# Patient Record
Sex: Female | Born: 1985 | Race: Black or African American | Hispanic: No | Marital: Married | Smoking: Current every day smoker
Health system: Southern US, Community
[De-identification: ages and names within clinical notes are randomized; demographics above are authoritative.]

## PROBLEM LIST (undated history)

## (undated) DIAGNOSIS — M549 Dorsalgia, unspecified: Secondary | ICD-10-CM

## (undated) DIAGNOSIS — J45909 Unspecified asthma, uncomplicated: Secondary | ICD-10-CM

## (undated) DIAGNOSIS — D649 Anemia, unspecified: Secondary | ICD-10-CM

## (undated) DIAGNOSIS — E119 Type 2 diabetes mellitus without complications: Secondary | ICD-10-CM

## (undated) DIAGNOSIS — E162 Hypoglycemia, unspecified: Secondary | ICD-10-CM

## (undated) DIAGNOSIS — G43909 Migraine, unspecified, not intractable, without status migrainosus: Secondary | ICD-10-CM

## (undated) HISTORY — PX: WISDOM TOOTH EXTRACTION: SHX21

## (undated) HISTORY — PX: LAPAROSCOPIC OVARIAN: SHX5906

## (undated) HISTORY — DX: Migraine, unspecified, not intractable, without status migrainosus: G43.909

## (undated) HISTORY — DX: Dorsalgia, unspecified: M54.9

## (undated) HISTORY — DX: Type 2 diabetes mellitus without complications: E11.9

---

## 2014-06-15 ENCOUNTER — Emergency Department: Payer: Self-pay | Admitting: Emergency Medicine

## 2014-09-09 ENCOUNTER — Emergency Department: Payer: Self-pay | Admitting: Emergency Medicine

## 2014-09-09 LAB — WET PREP, GENITAL

## 2014-09-09 LAB — GC/CHLAMYDIA PROBE AMP

## 2014-10-11 ENCOUNTER — Emergency Department: Payer: Self-pay | Admitting: Emergency Medicine

## 2014-10-11 LAB — CBC
HCT: 39.9 % (ref 35.0–47.0)
HGB: 12.9 g/dL (ref 12.0–16.0)
MCH: 28.5 pg (ref 26.0–34.0)
MCHC: 32.4 g/dL (ref 32.0–36.0)
MCV: 88 fL (ref 80–100)
Platelet: 209 10*3/uL (ref 150–440)
RBC: 4.53 10*6/uL (ref 3.80–5.20)
RDW: 13.5 % (ref 11.5–14.5)
WBC: 8.1 10*3/uL (ref 3.6–11.0)

## 2014-10-11 LAB — HCG, QUANTITATIVE, PREGNANCY

## 2014-10-11 LAB — WET PREP, GENITAL

## 2014-10-11 LAB — GC/CHLAMYDIA PROBE AMP

## 2015-06-05 ENCOUNTER — Emergency Department: Payer: Self-pay

## 2015-06-05 ENCOUNTER — Emergency Department
Admission: EM | Admit: 2015-06-05 | Discharge: 2015-06-05 | Disposition: A | Discharge status: Home / Self Care | Payer: Self-pay | Attending: Emergency Medicine | Admitting: Emergency Medicine

## 2015-06-05 DIAGNOSIS — Z91040 Latex allergy status: Secondary | ICD-10-CM | POA: Insufficient documentation

## 2015-06-05 DIAGNOSIS — N83201 Unspecified ovarian cyst, right side: Secondary | ICD-10-CM

## 2015-06-05 DIAGNOSIS — R102 Pelvic and perineal pain: Secondary | ICD-10-CM

## 2015-06-05 DIAGNOSIS — N8320 Unspecified ovarian cysts: Secondary | ICD-10-CM | POA: Insufficient documentation

## 2015-06-05 DIAGNOSIS — Z3202 Encounter for pregnancy test, result negative: Secondary | ICD-10-CM | POA: Insufficient documentation

## 2015-06-05 DIAGNOSIS — N858 Other specified noninflammatory disorders of uterus: Secondary | ICD-10-CM | POA: Insufficient documentation

## 2015-06-05 LAB — WET PREP, GENITAL
CLUE CELLS WET PREP: NONE SEEN
Trich, Wet Prep: NONE SEEN
Yeast Wet Prep HPF POC: NONE SEEN

## 2015-06-05 LAB — COMPREHENSIVE METABOLIC PANEL
ALK PHOS: 39 U/L (ref 38–126)
ALT: 12 U/L — ABNORMAL LOW (ref 14–54)
ANION GAP: 8 (ref 5–15)
AST: 15 U/L (ref 15–41)
Albumin: 4.1 g/dL (ref 3.5–5.0)
BUN: 12 mg/dL (ref 6–20)
CHLORIDE: 104 mmol/L (ref 101–111)
CO2: 26 mmol/L (ref 22–32)
CREATININE: 0.78 mg/dL (ref 0.44–1.00)
Calcium: 9.3 mg/dL (ref 8.9–10.3)
GFR calc Af Amer: >60 mL/min (ref >60)
GFR calc non Af Amer: >60 mL/min (ref >60)
GLUCOSE: 78 mg/dL (ref 65–99)
Potassium: 3.7 mmol/L (ref 3.5–5.1)
Sodium: 138 mmol/L (ref 135–145)
Total Bilirubin: 0.3 mg/dL (ref 0.3–1.2)
Total Protein: 7 g/dL (ref 6.5–8.1)

## 2015-06-05 LAB — CBC
HCT: 36.3 % (ref 35.0–47.0)
Hemoglobin: 12 g/dL (ref 12.0–16.0)
MCH: 28.6 pg (ref 26.0–34.0)
MCHC: 33.1 g/dL (ref 32.0–36.0)
MCV: 86.4 fL (ref 80.0–100.0)
PLATELETS: 193 10*3/uL (ref 150–440)
RBC: 4.2 MIL/uL (ref 3.80–5.20)
RDW: 13.8 % (ref 11.5–14.5)
WBC: 9.6 10*3/uL (ref 3.6–11.0)

## 2015-06-05 LAB — URINALYSIS COMPLETE WITH MICROSCOPIC (ARMC ONLY)
Bacteria, UA: NONE SEEN
Bilirubin Urine: NEGATIVE
GLUCOSE, UA: NEGATIVE mg/dL
Hgb urine dipstick: NEGATIVE
KETONES UR: NEGATIVE mg/dL
Nitrite: NEGATIVE
Protein, ur: NEGATIVE mg/dL
Specific Gravity, Urine: 1.019 (ref 1.005–1.030)
pH: 7 (ref 5.0–8.0)

## 2015-06-05 LAB — CHLAMYDIA/NGC RT PCR (ARMC ONLY)
CHLAMYDIA TR: NOT DETECTED
N gonorrhoeae: NOT DETECTED

## 2015-06-05 LAB — LIPASE, BLOOD: Lipase: 27 U/L (ref 22–51)

## 2015-06-05 LAB — POCT PREGNANCY, URINE: Preg Test, Ur: NEGATIVE

## 2015-06-05 NOTE — ED Notes (Signed)
Patient reports right lower quad pain.  States had ovarian cyst several months ago and this feels the same.

## 2015-06-05 NOTE — Discharge Instructions (Signed)
As discussed it is important that you get a follow-up ultrasound in 4-6 weeks, one week after menses. Please seek medical attention for any high fevers, chest pain, shortness of breath, change in behavior, persistent vomiting, bloody stool or any other new or concerning symptoms.  Ovarian Cyst An ovarian cyst is a fluid-filled sac that forms on an ovary. The ovaries are small organs that produce eggs in women. Various types of cysts can form on the ovaries. Most are not cancerous. Many do not cause problems, and they often go away on their own. Some may cause symptoms and require treatment. Common types of ovarian cysts include:  Functional cysts--These cysts may occur every month during the menstrual cycle. This is normal. The cysts usually go away with the next menstrual cycle if the woman does not get pregnant. Usually, there are no symptoms with a functional cyst.  Endometrioma cysts--These cysts form from the tissue that lines the uterus. They are also called "chocolate cysts" because they become filled with blood that turns brown. This type of cyst can cause pain in the lower abdomen during intercourse and with your menstrual period.  Cystadenoma cysts--This type develops from the cells on the outside of the ovary. These cysts can get very big and cause lower abdomen pain and pain with intercourse. This type of cyst can twist on itself, cut off its blood supply, and cause severe pain. It can also easily rupture and cause a lot of pain.  Dermoid cysts--This type of cyst is sometimes found in both ovaries. These cysts may contain different kinds of body tissue, such as skin, teeth, hair, or cartilage. They usually do not cause symptoms unless they get very big.  Theca lutein cysts--These cysts occur when too much of a certain hormone (human chorionic gonadotropin) is produced and overstimulates the ovaries to produce an egg. This is most common after procedures used to assist with the conception of  a baby (in vitro fertilization). CAUSES   Fertility drugs can cause a condition in which multiple large cysts are formed on the ovaries. This is called ovarian hyperstimulation syndrome.  A condition called polycystic ovary syndrome can cause hormonal imbalances that can lead to nonfunctional ovarian cysts. SIGNS AND SYMPTOMS  Many ovarian cysts do not cause symptoms. If symptoms are present, they may include:  Pelvic pain or pressure.  Pain in the lower abdomen.  Pain during sexual intercourse.  Increasing girth (swelling) of the abdomen.  Abnormal menstrual periods.  Increasing pain with menstrual periods.  Stopping having menstrual periods without being pregnant. DIAGNOSIS  These cysts are commonly found during a routine or annual pelvic exam. Tests may be ordered to find out more about the cyst. These tests may include:  Ultrasound.  X-ray of the pelvis.  CT scan.  MRI.  Blood tests. TREATMENT  Many ovarian cysts go away on their own without treatment. Your health care provider may want to check your cyst regularly for 2-3 months to see if it changes. For women in menopause, it is particularly important to monitor a cyst closely because of the higher rate of ovarian cancer in menopausal women. When treatment is needed, it may include any of the following:  A procedure to drain the cyst (aspiration). This may be done using a long needle and ultrasound. It can also be done through a laparoscopic procedure. This involves using a thin, lighted tube with a tiny camera on the end (laparoscope) inserted through a small incision.  Surgery to remove the  whole cyst. This may be done using laparoscopic surgery or an open surgery involving a larger incision in the lower abdomen.  Hormone treatment or birth control pills. These methods are sometimes used to help dissolve a cyst. HOME CARE INSTRUCTIONS   Only take over-the-counter or prescription medicines as directed by your health  care provider.  Follow up with your health care provider as directed.  Get regular pelvic exams and Pap tests. SEEK MEDICAL CARE IF:   Your periods are late, irregular, or painful, or they stop.  Your pelvic pain or abdominal pain does not go away.  Your abdomen becomes larger or swollen.  You have pressure on your bladder or trouble emptying your bladder completely.  You have pain during sexual intercourse.  You have feelings of fullness, pressure, or discomfort in your stomach.  You lose weight for no apparent reason.  You feel generally ill.  You become constipated.  You lose your appetite.  You develop acne.  You have an increase in body and facial hair.  You are gaining weight, without changing your exercise and eating habits.  You think you are pregnant. SEEK IMMEDIATE MEDICAL CARE IF:   You have increasing abdominal pain.  You feel sick to your stomach (nauseous), and you throw up (vomit).  You develop a fever that comes on suddenly.  You have abdominal pain during a bowel movement.  Your menstrual periods become heavier than usual. MAKE SURE YOU:  Understand these instructions.  Will watch your condition.  Will get help right away if you are not doing well or get worse. Document Released: 10/28/2005 Document Revised: 11/02/2013 Document Reviewed: 07/05/2013 [facility] Patient Information 2015 [place], [place]. This information is not intended to replace advice given to you by your health care provider. Make sure you discuss any questions you have with your health care provider.

## 2015-06-05 NOTE — ED Provider Notes (Signed)
[facility] Emergency Department Provider Note  ____________________________________________  Time seen: 0815  I have reviewed the triage vital signs and the nursing notes.   HISTORY  Chief Complaint Abdominal Pain   History limited by: Not Limited   HPI Angela Douglas is a 29 y.o. female who presents to the emergency department today because of right lower quadrant pain. She states pain is been going on for 3 weeks. It is waxing and waning. It is worse with direct pressure, and her course or urination. She has had some clear vaginal discharge without a foul odor. No vaginal bleeding. The patient states that she has had similar pain in the past and in the past was diagnosed with a right ovarian cyst. She denies any fever.   No past medical history on file.  There are no active problems to display for this patient.   No past surgical history on file.  No current outpatient prescriptions on file.  Allergies Banana and Latex  No family history on file.  Social History History  Substance Use Topics  . Smoking status: Not on file  . Smokeless tobacco: Not on file  . Alcohol Use: Not on file    Review of Systems  Constitutional: Negative for fever. Cardiovascular: Negative for chest pain. Respiratory: Negative for shortness of breath. Gastrointestinal: Right lower quadrant pain Genitourinary: Negative for dysuria. Musculoskeletal: Negative for back pain. Skin: Negative for rash. Neurological: Negative for headaches, focal weakness or numbness.   10-point ROS otherwise negative.  ____________________________________________   PHYSICAL EXAM:  VITAL SIGNS: ED Triage Vitals  Enc Vitals Group     BP 06/05/15 0624 120/66 mmHg     Pulse Rate 06/05/15 0624 64     Resp 06/05/15 0624 20     Temp 06/05/15 0624 98.3 F (36.8 C)     Temp Source 06/05/15 0624 Oral     SpO2 06/05/15 0624 98 %     Weight 06/05/15 0624 220 lb (99.791 kg)      Height 06/05/15 0624 5\' 6"  (1.676 m)     Head Cir --      Peak Flow --      Pain Score 06/05/15 0625 6   Constitutional: Alert and oriented. Well appearing and in no distress. Eyes: Conjunctivae are normal. PERRL. Normal extraocular movements. ENT   Head: Normocephalic and atraumatic.   Nose: No congestion/rhinnorhea.   Mouth/Throat: Mucous membranes are moist.   Neck: No stridor. Hematological/Lymphatic/Immunilogical: No cervical lymphadenopathy. Cardiovascular: Normal rate, regular rhythm.  No murmurs, rubs, or gallops. Respiratory: Normal respiratory effort without tachypnea nor retractions. Breath sounds are clear and equal bilaterally. No wheezes/rales/rhonchi. Gastrointestinal: Soft and minimally tender to palpation right lower quadrant. Genitourinary: No external lesions. No blood in vaginal vault. Normal vaginal discharge. No CMT. Positive right adnexal tenderness without fullness.  Musculoskeletal: Normal range of motion in all extremities. No joint effusions.  No lower extremity tenderness nor edema. Neurologic:  Normal speech and language. No gross focal neurologic deficits are appreciated. Speech is normal.  Skin:  Skin is warm, dry and intact. No rash noted. Psychiatric: Mood and affect are normal. Speech and behavior are normal. Patient exhibits appropriate insight and judgment.  ____________________________________________    LABS (pertinent positives/negatives)  Labs Reviewed  COMPREHENSIVE METABOLIC PANEL - Abnormal; Notable for the following:    ALT 12 (*)    All other components within normal limits  URINALYSIS COMPLETEWITH MICROSCOPIC ([facility] ONLY) - Abnormal; Notable for the following:    Color,  Urine YELLOW (*)    APPearance CLEAR (*)    Leukocytes, UA TRACE (*)    Squamous Epithelial / LPF 6-30 (*)    All other components within normal limits  LIPASE, BLOOD  CBC  POC URINE PREG, ED  POCT PREGNANCY, URINE      ____________________________________________   EKG  None  ____________________________________________    RADIOLOGY  Ultrasound IMPRESSION: No sonographic evidence for acute abnormality or torsion.  Simple appearing right ovarian cysts. Because the patient is symptomatic, followup pelvic ultrasound is recommended in 4-6 weeks during the week immediately following the patient's menses. ____________________________________________   PROCEDURES  Procedure(s) performed: None  Critical Care performed: No  ____________________________________________   INITIAL IMPRESSION / ASSESSMENT AND PLAN / ED COURSE  Pertinent labs & imaging results that were available during my care of the patient were reviewed by me and considered in my medical decision making (see chart for details).  Patient presents to the emergency department today because of right lower abdominal pelvic pain. Exam with some minimal right adnexal tenderness. Ultrasound shows a right ovarian cyst without any accompanying findings. Discussed this with patient. Discussed importance of follow-up with OB/GYN doctor as well as follow-up ultrasound.  ____________________________________________   FINAL CLINICAL IMPRESSION(S) / ED DIAGNOSES  Final diagnoses:  Right adnexal tenderness  Cyst of right ovary     Phineas Semen, MD 06/05/15 1105

## 2016-01-02 ENCOUNTER — Emergency Department
Admission: EM | Admit: 2016-01-02 | Discharge: 2016-01-02 | Disposition: A | Discharge status: Home / Self Care | Payer: Medicaid Other | Attending: Emergency Medicine | Admitting: Emergency Medicine

## 2016-01-02 ENCOUNTER — Encounter: Payer: Self-pay | Admitting: Emergency Medicine

## 2016-01-02 DIAGNOSIS — F17200 Nicotine dependence, unspecified, uncomplicated: Secondary | ICD-10-CM | POA: Insufficient documentation

## 2016-01-02 DIAGNOSIS — J209 Acute bronchitis, unspecified: Secondary | ICD-10-CM | POA: Diagnosis not present

## 2016-01-02 DIAGNOSIS — Z91040 Latex allergy status: Secondary | ICD-10-CM | POA: Diagnosis not present

## 2016-01-02 DIAGNOSIS — R509 Fever, unspecified: Secondary | ICD-10-CM | POA: Diagnosis present

## 2016-01-02 DIAGNOSIS — J40 Bronchitis, not specified as acute or chronic: Secondary | ICD-10-CM

## 2016-01-02 MED ORDER — AZITHROMYCIN 250 MG PO TABS: ORAL_TABLET | ORAL | Status: AC

## 2016-01-02 MED ORDER — IBUPROFEN 800 MG PO TABS: 800.0000 mg | ORAL_TABLET | Freq: Three times a day (TID) | ORAL | Status: DC | PRN

## 2016-01-02 MED ORDER — PSEUDOEPH-BROMPHEN-DM 30-2-10 MG/5ML PO SYRP: 5.0000 mL | ORAL_SOLUTION | Freq: Four times a day (QID) | ORAL | Status: DC | PRN

## 2016-01-02 MED ORDER — KETOROLAC TROMETHAMINE 60 MG/2ML IM SOLN
60.0000 mg | Freq: Once | INTRAMUSCULAR | Status: AC
  Administered 2016-01-02: 60 mg via INTRAMUSCULAR
  Filled 2016-01-02: qty 2

## 2016-01-02 NOTE — ED Notes (Signed)
Pt to ed with c/o cough, congestion, fever through the weekend.  Pt reports symtpoms started on Sat.  No fever since yesterday.  Pt states coughing up green sputum.

## 2016-01-02 NOTE — ED Provider Notes (Signed)
[facility] Emergency Department Provider Note  ____________________________________________  Time seen: Approximately 11:39 AM  I have reviewed the triage vital signs and the nursing notes.   HISTORY  Chief Complaint Fever    HPI Meredith Kilbride is a 30 y.o. female patient complaining of cough and nasal congestion fever for 4 days. Patient state no fever today. Patient states she has a productive green cough. Patient denies any nausea vomiting or diarrhea associated this complaint. Patient states she is taking ibuprofen and Tylenol for this complaint. Patient rates her pain discomfort as a 6/10. No palliative measures taken for this complaint.   History reviewed. No pertinent past medical history.  There are no active problems to display for this patient.   History reviewed. No pertinent past surgical history.  Current Outpatient Rx  Name  Route  Sig  Dispense  Refill  . acetaminophen (TYLENOL) 650 MG CR tablet   Oral   Take 650 mg by mouth 2 (two) times daily as needed for pain.         Marland Kitchen azithromycin (ZITHROMAX Z-PAK) 250 MG tablet      Take 2 tablets (500 mg) on  Day 1,  followed by 1 tablet (250 mg) once daily on Days 2 through 5.   6 each   0   . brompheniramine-pseudoephedrine-DM 30-2-10 MG/5ML syrup   Oral   Take 5 mLs by mouth 4 (four) times daily as needed.   120 mL   0   . ibuprofen (ADVIL,MOTRIN) 200 MG tablet   Oral   Take 800 mg by mouth 2 (two) times daily as needed for moderate pain.         Marland Kitchen ibuprofen (ADVIL,MOTRIN) 800 MG tablet   Oral   Take 1 tablet (800 mg total) by mouth every 8 (eight) hours as needed for moderate pain.   15 tablet   0     Allergies Banana and Latex  History reviewed. No pertinent family history.  Social History Social History  Substance Use Topics  . Smoking status: Current Every Day Smoker  . Smokeless tobacco: None  . Alcohol Use: Yes    Review of Systems Constitutional:  Fever and body aches.  Eyes: No visual changes. ENT: No sore throat. Nasal congestion Cardiovascular: Denies chest pain. Respiratory: Denies shortness of breath. Greenish productive cough Gastrointestinal: No abdominal pain.  No nausea, no vomiting.  No diarrhea.  No constipation. Genitourinary: Negative for dysuria. Musculoskeletal: Negative for back pain. Skin: Negative for rash. Neurological: Negative for headaches, focal weakness or numbness. 10-point ROS otherwise negative.  ____________________________________________   PHYSICAL EXAM:  VITAL SIGNS: ED Triage Vitals  Enc Vitals Group     BP 01/02/16 1041 119/75 mmHg     Pulse Rate 01/02/16 1041 68     Resp 01/02/16 1041 20     Temp 01/02/16 1041 98.5 F (36.9 C)     Temp Source 01/02/16 1041 Oral     SpO2 01/02/16 1041 97 %     Weight 01/02/16 1041 230 lb (104.327 kg)     Height 01/02/16 1041  (1.702 m)     Head Cir --      Peak Flow --      Pain Score 01/02/16 1041 6     Pain Loc --      Pain Edu? --      Excl. in GC? --    Constitutional: Alert and oriented. Well appearing and in no acute distress. Eyes: Conjunctivae  are normal. PERRL. EOMI. Head: Atraumatic. Nose:  Bilateral maxillary guarding with clear rhinorrhea. Mouth/Throat: Mucous membranes are moist.  Oropharynx non-erythematous. Neck: No stridor.  No cervical spine tenderness to palpation. Hematological/Lymphatic/Immunilogical: No cervical lymphadenopathy. Cardiovascular: Normal rate, regular rhythm. Grossly normal heart sounds.  Good peripheral circulation. Respiratory: Normal respiratory effort.  No retractions. Lungs Rales  Gastrointestinal: Soft and nontender. No distention. No abdominal bruits. No CVA tenderness. Musculoskeletal: No lower extremity tenderness nor edema.  No joint effusions. Neurologic:  Normal speech and language. No gross focal neurologic deficits are appreciated. No gait instability. Skin:  Skin is warm, dry and intact. No  rash noted. Psychiatric: Mood and affect are normal. Speech and behavior are normal.  ____________________________________________   LABS (all labs ordered are listed, but only abnormal results are displayed)  Labs Reviewed - No data to display ____________________________________________  EKG   ____________________________________________  RADIOLOGY   ____________________________________________   PROCEDURES  Procedure(s) performed: None  Critical Care performed: No  ____________________________________________   INITIAL IMPRESSION / ASSESSMENT AND PLAN / ED COURSE  Pertinent labs & imaging results that were available during my care of the patient were reviewed by me and considered in my medical decision making (see chart for details).  Bronchitis. She given discharge care instructions. Patient given a work note for 2 days. Patient given prescription for Bactrim DS, Bromfed DM, and ibuprofen. Patient advised to [facility] if no improvement 2-3 days. ____________________________________________   FINAL CLINICAL IMPRESSION(S) / ED DIAGNOSES  Final diagnoses:  Bronchitis      Joni Reining, PA-C 01/03/16 1450  Richardean Canal, MD 01/04/16 [phone]

## 2016-12-22 ENCOUNTER — Emergency Department: Payer: Medicaid Other

## 2016-12-22 ENCOUNTER — Encounter: Payer: Self-pay | Admitting: Emergency Medicine

## 2016-12-22 ENCOUNTER — Emergency Department
Admission: EM | Admit: 2016-12-22 | Discharge: 2016-12-22 | Disposition: A | Discharge status: Home / Self Care | Payer: Medicaid Other | Attending: Emergency Medicine | Admitting: Emergency Medicine

## 2016-12-22 DIAGNOSIS — B664 Paragonimiasis: Secondary | ICD-10-CM

## 2016-12-22 DIAGNOSIS — F17200 Nicotine dependence, unspecified, uncomplicated: Secondary | ICD-10-CM | POA: Insufficient documentation

## 2016-12-22 DIAGNOSIS — R042 Hemoptysis: Secondary | ICD-10-CM | POA: Insufficient documentation

## 2016-12-22 DIAGNOSIS — Z91040 Latex allergy status: Secondary | ICD-10-CM | POA: Insufficient documentation

## 2016-12-22 DIAGNOSIS — J45909 Unspecified asthma, uncomplicated: Secondary | ICD-10-CM | POA: Diagnosis not present

## 2016-12-22 DIAGNOSIS — Z79899 Other long term (current) drug therapy: Secondary | ICD-10-CM | POA: Diagnosis not present

## 2016-12-22 DIAGNOSIS — R05 Cough: Secondary | ICD-10-CM | POA: Diagnosis present

## 2016-12-22 HISTORY — DX: Hypoglycemia, unspecified: E16.2

## 2016-12-22 HISTORY — DX: Unspecified asthma, uncomplicated: J45.909

## 2016-12-22 LAB — POCT PREGNANCY, URINE: Preg Test, Ur: NEGATIVE

## 2016-12-22 MED ORDER — PSEUDOEPH-BROMPHEN-DM 30-2-10 MG/5ML PO SYRP: 5.0000 mL | ORAL_SOLUTION | Freq: Four times a day (QID) | ORAL | 0 refills | Status: DC | PRN

## 2016-12-22 NOTE — ED Provider Notes (Signed)
[facility] Emergency Department Provider Note   ____________________________________________   First MD Initiated Contact with Patient 12/22/16 1506     (approximate)  I have reviewed the triage vital signs and the nursing notes.   HISTORY  Chief Complaint Hemoptysis    HPI Angela Douglas is a 31 y.o. female patient complain intermittently productive cough for 3 weeks. Patient stated the last 2-3 days seen minimal amount of blood in her mucus. Patient denies any fever.  The patient denies any dyspnea. Patient denies night sweats.Patient  last menstrual period was the last week of December. Patient denies pain to her complaint. No palliative measures for her complaint.   Past Medical History:  Diagnosis Date  . Asthma   . Hypoglycemia     There are no active problems to display for this patient.   Past Surgical History:  Procedure Laterality Date  . LAPAROSCOPIC OVARIAN      Prior to Admission medications   Medication Sig Start Date End Date Taking? Authorizing Provider  acetaminophen (TYLENOL) 650 MG CR tablet Take 650 mg by mouth 2 (two) times daily as needed for pain.    Historical Provider, MD  brompheniramine-pseudoephedrine-DM 30-2-10 MG/5ML syrup Take 5 mLs by mouth 4 (four) times daily as needed. 01/02/16   Joni Reiningonald K Sebastian Lurz, PA-C  brompheniramine-pseudoephedrine-DM 30-2-10 MG/5ML syrup Take 5 mLs by mouth 4 (four) times daily as needed. 12/22/16   Joni Reiningonald K Treavor Blomquist, PA-C  ibuprofen (ADVIL,MOTRIN) 200 MG tablet Take 800 mg by mouth 2 (two) times daily as needed for moderate pain.    Historical Provider, MD  ibuprofen (ADVIL,MOTRIN) 800 MG tablet Take 1 tablet (800 mg total) by mouth every 8 (eight) hours as needed for moderate pain. 01/02/16   Joni Reiningonald K Ysidra Sopher, PA-C    Allergies Banana and Latex  No family history on file.  Social History Social History  Substance Use Topics  . Smoking status: Current Every Day Smoker  . Smokeless  tobacco: Never Used  . Alcohol use Yes    Review of Systems Constitutional: No fever/chills Eyes: No visual changes. ENT: No sore throat. Cardiovascular: Denies chest pain. Respiratory: Denies shortness of breath. Bloody productive cough Gastrointestinal: No abdominal pain.  No nausea, no vomiting.  No diarrhea.  No constipation. Genitourinary: Negative for dysuria. Musculoskeletal: Negative for back pain. Skin: Negative for rash. Neurological: Negative for headaches, focal weakness or numbness. Allergic/Immunilogical: Banana and latex ____________________________________________   PHYSICAL EXAM:  VITAL SIGNS: ED Triage Vitals [12/22/16 1402]  Enc Vitals Group     BP      Pulse      Resp      Temp      Temp src      SpO2      Weight 222 lb (100.7 kg)     Height 5\' 7"  (1.702 m)     Head Circumference      Peak Flow      Pain Score      Pain Loc      Pain Edu?      Excl. in GC?     Constitutional: Alert and oriented. Well appearing and in no acute distress.Morbid obesity Eyes: Conjunctivae are normal. PERRL. EOMI. Head: Atraumatic. Nose: Edematous nasal turbinates. Clear rhinorrhea.  Mouth/Throat: Mucous membranes are moist.  Oropharynx non-erythematous. Neck: No stridor. No cervical spine tenderness to palpation. Hematological/Lymphatic/Immunilogical: No cervical lymphadenopathy. }Cardiovascular: Normal rate, regular rhythm. Grossly normal heart sounds.  Good peripheral circulation. Respiratory: Normal respiratory effort.  No retractions. Lungs CTAB. Gastrointestinal: Soft and nontender. No distention. No abdominal bruits. No CVA tenderness. Musculoskeletal: No lower extremity tenderness nor edema.  No joint effusions. Neurologic:  Normal speech and language. No gross focal neurologic deficits are appreciated. No gait instability. Skin:  Skin is warm, dry and intact. No rash noted. Psychiatric: Mood and affect are normal. Speech and behavior are  normal.  ____________________________________________   LABS (all labs ordered are listed, but only abnormal results are displayed)  Labs Reviewed  POCT PREGNANCY, URINE  POC URINE PREG, ED   ____________________________________________  EKG   ____________________________________________  RADIOLOGY  No acute findings on chest x-ray ____________________________________________   PROCEDURES  Procedure(s) performed: None  Procedures  Critical Care performed: No  ____________________________________________   INITIAL IMPRESSION / ASSESSMENT AND PLAN / ED COURSE  Pertinent labs & imaging results that were available during my care of the patient were reviewed by me and considered in my medical decision making (see chart for details).  Mild hemoptysis. Patient given discharge care instructions. Patient given a prescription for Bromfed DM. Does negative pregnancy test results with patient.      ____________________________________________   FINAL CLINICAL IMPRESSION(S) / ED DIAGNOSES  Final diagnoses:  Hemoptysis, endemic (HCC)      NEW MEDICATIONS STARTED DURING THIS VISIT:  New Prescriptions   BROMPHENIRAMINE-PSEUDOEPHEDRINE-DM 30-2-10 MG/5ML SYRUP    Take 5 mLs by mouth 4 (four) times daily as needed.     Note:  This document was prepared using Dragon voice recognition software and may include unintentional dictation errors.    Joni Reining, PA-C 12/22/16 1533    Emily Filbert, MD 12/22/16 1535

## 2016-12-22 NOTE — ED Triage Notes (Signed)
Pt comes into the ED via POV c/o cough that has been going on for 3 weeks.  Patient states that she saw minimal blood in the mucous when she coughed.  Patient appears in NAD at this time with even and unlabored respirations.

## 2016-12-22 NOTE — ED Notes (Signed)
NAD noted at time of D/C. Pt denies questions or concerns. Pt ambulatory to the lobby at this time.  

## 2016-12-22 NOTE — ED Notes (Signed)
Pt c/o cough x 3 weeks, states several days ago coughed up green mucous with red streaks in the mucous. No respiratory distress noted at this time. This RN collected urine for POC Preg.

## 2017-03-08 ENCOUNTER — Emergency Department
Admission: EM | Admit: 2017-03-08 | Discharge: 2017-03-08 | Disposition: A | Discharge status: Home / Self Care | Payer: Medicaid Other | Attending: Emergency Medicine | Admitting: Emergency Medicine

## 2017-03-08 ENCOUNTER — Emergency Department: Payer: Medicaid Other

## 2017-03-08 ENCOUNTER — Encounter: Payer: Self-pay | Admitting: Medical Oncology

## 2017-03-08 DIAGNOSIS — X501XXA Overexertion from prolonged static or awkward postures, initial encounter: Secondary | ICD-10-CM | POA: Insufficient documentation

## 2017-03-08 DIAGNOSIS — Z791 Long term (current) use of non-steroidal anti-inflammatories (NSAID): Secondary | ICD-10-CM | POA: Insufficient documentation

## 2017-03-08 DIAGNOSIS — J45909 Unspecified asthma, uncomplicated: Secondary | ICD-10-CM | POA: Diagnosis not present

## 2017-03-08 DIAGNOSIS — Y929 Unspecified place or not applicable: Secondary | ICD-10-CM | POA: Insufficient documentation

## 2017-03-08 DIAGNOSIS — S92522A Displaced fracture of medial phalanx of left lesser toe(s), initial encounter for closed fracture: Secondary | ICD-10-CM | POA: Insufficient documentation

## 2017-03-08 DIAGNOSIS — Y9389 Activity, other specified: Secondary | ICD-10-CM | POA: Insufficient documentation

## 2017-03-08 DIAGNOSIS — S99922A Unspecified injury of left foot, initial encounter: Secondary | ICD-10-CM | POA: Diagnosis present

## 2017-03-08 DIAGNOSIS — Z79899 Other long term (current) drug therapy: Secondary | ICD-10-CM | POA: Diagnosis not present

## 2017-03-08 DIAGNOSIS — F17200 Nicotine dependence, unspecified, uncomplicated: Secondary | ICD-10-CM | POA: Diagnosis not present

## 2017-03-08 DIAGNOSIS — Y999 Unspecified external cause status: Secondary | ICD-10-CM | POA: Diagnosis not present

## 2017-03-08 MED ORDER — TRAMADOL HCL 50 MG PO TABS: 50.0000 mg | ORAL_TABLET | Freq: Four times a day (QID) | ORAL | 0 refills | Status: AC | PRN

## 2017-03-08 MED ORDER — NAPROXEN 500 MG PO TABS: 500.0000 mg | ORAL_TABLET | Freq: Two times a day (BID) | ORAL | 0 refills | Status: DC

## 2017-03-08 NOTE — ED Provider Notes (Signed)
[facility] Emergency Department Provider Note   ____________________________________________   First MD Initiated Contact with Patient 03/08/17 1428     (approximate)  I have reviewed the triage vital signs and the nursing notes.   HISTORY  Chief Complaint Foot Pain    HPI Angela Douglas is a 31 y.o. female left dorsal foot pain 3-4 weeks. Patient stated onset secondary to eversion incident while moving furniture. Patient state this is calm pounds by her jaw which required prolonged standing. Patient stated pain wax and wane but is increased over the past week. No relief with rest and anti-inflammatory medications. Patient rates the pain as a 6/10. Patient described a pain as "achy". Past Medical History:  Diagnosis Date  . Asthma   . Hypoglycemia     There are no active problems to display for this patient.   Past Surgical History:  Procedure Laterality Date  . LAPAROSCOPIC OVARIAN      Prior to Admission medications   Medication Sig Start Date End Date Taking? Authorizing Provider  acetaminophen (TYLENOL) 650 MG CR tablet Take 650 mg by mouth 2 (two) times daily as needed for pain.    Historical Provider, MD  brompheniramine-pseudoephedrine-DM 30-2-10 MG/5ML syrup Take 5 mLs by mouth 4 (four) times daily as needed. 01/02/16   Joni Reining, PA-C  brompheniramine-pseudoephedrine-DM 30-2-10 MG/5ML syrup Take 5 mLs by mouth 4 (four) times daily as needed. 12/22/16   Joni Reining, PA-C  ibuprofen (ADVIL,MOTRIN) 200 MG tablet Take 800 mg by mouth 2 (two) times daily as needed for moderate pain.    Historical Provider, MD  ibuprofen (ADVIL,MOTRIN) 800 MG tablet Take 1 tablet (800 mg total) by mouth every 8 (eight) hours as needed for moderate pain. 01/02/16   Joni Reining, PA-C  naproxen (NAPROSYN) 500 MG tablet Take 1 tablet (500 mg total) by mouth 2 (two) times daily with a meal. 03/08/17   Joni Reining, PA-C  traMADol (ULTRAM) 50 MG tablet  Take 1 tablet (50 mg total) by mouth every 6 (six) hours as needed. 03/08/17 03/08/18  Joni Reining, PA-C    Allergies Banana and Latex  No family history on file.  Social History Social History  Substance Use Topics  . Smoking status: Current Every Day Smoker  . Smokeless tobacco: Never Used  . Alcohol use Yes    Review of Systems  Constitutional: No fever/chills Eyes: No visual changes. ENT: No sore throat. Cardiovascular: Denies chest pain. Respiratory: Denies shortness of breath. Gastrointestinal: No abdominal pain.  No nausea, no vomiting.  No diarrhea.  No constipation. Genitourinary: Negative for dysuria. Musculoskeletal: Negative for back pain. Skin: Negative for rash. Neurological: Negative for headaches, focal weakness or numbness. Allergic/Immunilogical: Bananas and latex ____________________________________________   PHYSICAL EXAM:  VITAL SIGNS: ED Triage Vitals  Enc Vitals Group     BP 03/08/17 1331 118/76     Pulse Rate 03/08/17 1331 83     Resp 03/08/17 1331 18     Temp 03/08/17 1331 99.2 F (37.3 C)     Temp Source 03/08/17 1331 Oral     SpO2 03/08/17 1331 97 %     Weight 03/08/17 1329 222 lb (100.7 kg)     Height --      Head Circumference --      Peak Flow --      Pain Score 03/08/17 1329 6     Pain Loc --      Pain Edu? --  Excl. in GC? --     Constitutional: Alert and oriented. Well appearing and in no acute distress. Eyes: Conjunctivae are normal. PERRL. EOMI. Head: Atraumatic. Nose: No congestion/rhinnorhea. Mouth/Throat: Mucous membranes are moist.  Oropharynx non-erythematous. Neck: No stridor.  No cervical spine tenderness to palpation. Hematological/Lymphatic/Immunilogical: No cervical lymphadenopathy. Cardiovascular: Normal rate, regular rhythm. Grossly normal heart sounds.  Good peripheral circulation. Respiratory: Normal respiratory effort.  No retractions. Lungs CTAB. Gastrointestinal: Soft and nontender. No distention.  No abdominal bruits. No CVA tenderness. Musculoskeletal: No obvious deformity or edema to the os aspect of the left foot. Patient is moderate guarding palpation of dorsal aspect of the left foot. Patient ambulates with atypical gait. Neurologic:  Normal speech and language. No gross focal neurologic deficits are appreciated. No gait instability. Skin:  Skin is warm, dry and intact. No rash noted. Psychiatric: Mood and affect are normal. Speech and behavior are normal.  ____________________________________________   LABS (all labs ordered are listed, but only abnormal results are displayed)  Labs Reviewed - No data to display ____________________________________________  EKG   ____________________________________________  RADIOLOGY  Subtle lucency base of 2nd middle phalanx left foot. ____________________________________________   PROCEDURES  Procedure(s) performed: None  Procedures  Critical Care performed: No  ____________________________________________   INITIAL IMPRESSION / ASSESSMENT AND PLAN / ED COURSE  Pertinent labs & imaging results that were available during my care of the patient were reviewed by me and considered in my medical decision making (see chart for details).  Subtle fracture 2nd digit left foot. Patient given discharge care instructions and advised follow-up with podiatry if no improvement in 2 weeks.      ____________________________________________   FINAL CLINICAL IMPRESSION(S) / ED DIAGNOSES  Final diagnoses:  Closed non-physeal fracture of middle phalanx of lesser toe of left foot, initial encounter      NEW MEDICATIONS STARTED DURING THIS VISIT:  New Prescriptions   NAPROXEN (NAPROSYN) 500 MG TABLET    Take 1 tablet (500 mg total) by mouth 2 (two) times daily with a meal.   TRAMADOL (ULTRAM) 50 MG TABLET    Take 1 tablet (50 mg total) by mouth every 6 (six) hours as needed.     Note:  This document was prepared using Dragon  voice recognition software and may include unintentional dictation errors.    Joni Reining, PA-C 03/08/17 1522    Governor Rooks, MD 03/09/17 [phone]

## 2017-03-08 NOTE — ED Notes (Signed)
Pt stating "I have done something to my left foot." Pt was moving furniture about two months ago and "rolled" her left foot. Pt stating that she has pain in the middle of her foot for the last 2 months but the pain has been increasing. Pt denies numbness, but tingling at times.

## 2017-03-08 NOTE — ED Triage Notes (Signed)
Pt reports left foot pain that occurred a while back after twisting it while moving furniture.

## 2017-03-08 NOTE — Discharge Instructions (Signed)
Keep toe Tape and wear open shoe for 2 weeks.

## 2017-03-14 DIAGNOSIS — M659 Synovitis and tenosynovitis, unspecified: Secondary | ICD-10-CM | POA: Diagnosis not present

## 2017-12-18 ENCOUNTER — Encounter: Payer: Self-pay | Admitting: Emergency Medicine

## 2017-12-18 ENCOUNTER — Other Ambulatory Visit: Payer: Self-pay

## 2017-12-18 ENCOUNTER — Emergency Department
Admission: EM | Admit: 2017-12-18 | Discharge: 2017-12-18 | Disposition: A | Discharge status: Home / Self Care | Payer: Medicaid Other | Attending: Emergency Medicine | Admitting: Emergency Medicine

## 2017-12-18 DIAGNOSIS — K0889 Other specified disorders of teeth and supporting structures: Secondary | ICD-10-CM | POA: Diagnosis not present

## 2017-12-18 DIAGNOSIS — F17200 Nicotine dependence, unspecified, uncomplicated: Secondary | ICD-10-CM | POA: Diagnosis not present

## 2017-12-18 DIAGNOSIS — J45909 Unspecified asthma, uncomplicated: Secondary | ICD-10-CM | POA: Insufficient documentation

## 2017-12-18 HISTORY — DX: Anemia, unspecified: D64.9

## 2017-12-18 MED ORDER — KETOROLAC TROMETHAMINE 30 MG/ML IJ SOLN
30.0000 mg | Freq: Once | INTRAMUSCULAR | Status: AC
  Administered 2017-12-18: 30 mg via INTRAMUSCULAR
  Filled 2017-12-18: qty 1

## 2017-12-18 MED ORDER — AMOXICILLIN 500 MG PO TABS: 500.0000 mg | ORAL_TABLET | Freq: Two times a day (BID) | ORAL | 0 refills | Status: AC

## 2017-12-18 MED ORDER — KETOROLAC TROMETHAMINE 10 MG PO TABS: 10.0000 mg | ORAL_TABLET | Freq: Four times a day (QID) | ORAL | 0 refills | Status: AC | PRN

## 2017-12-18 NOTE — Discharge Instructions (Signed)
OPTIONS FOR DENTAL FOLLOW UP CARE ° °[place] Department of Health and Human Services - Local Safety Net Dental Clinics °http://www.ncdhhs.gov/dph/oralhealth/services/safetynetclinics.htm °  °[facility] (336-562-3123) ° °[facility] [place] (919-933-9087) ° °[facility] ([phone] ext [phone]) ° °[facility] ([phone]) ° °[facility] ([phone]) °This clinic caters to the indigent population and is on a lottery system. °Location: °[facility], [address], [place] °Clinic Hours: °Wednesdays from 6pm - 9pm, patients seen by a lottery system. °For dates, call or go to [email or web address].[facility].edu/[facility]/patients/Dental-[facility] °Services: °Cleanings, fillings and simple extractions. °Payment Options: °DENTAL WORK IS FREE OF CHARGE. Bring proof of income or support. °Best way to get seen: °Arrive at 5:15 pm - this is a lottery, NOT first come/first serve, so arriving earlier will not increase your chances of being seen. °  °  °[facility] °[phone] °Select option 1 for emergencies °  °Location: °[facility], [address], [place] °Clinic Hours: °No walk-ins accepted - call the day before to schedule an appointment. °Check in times are 9:30 am and 1:30 pm. °Services: °Simple extractions, temporary fillings, pulpectomy/pulp debridement, uncomplicated abscess drainage. °Payment Options: °PAYMENT IS DUE AT THE TIME OF SERVICE.  Fee is usually $100-200, additional surgical procedures (e.g. abscess drainage) may be extra. °Cash, checks, Visa/MasterCard accepted.  Can file Medicaid if patient is covered for dental - patient should call case worker to check. °No discount for [facility] patients. °Best way to get seen: °MUST call the day before and get onto the schedule. Can usually be seen the next 1-2 days. No walk-ins accepted. °  °  °[facility] °[phone] °   °Location: °[facility], [address], [place] °Clinic Hours: °M, W, Th, F 8am or 1:30pm, Tues 9a or 1:30 - first come/first served. °Services: °Simple extractions, temporary fillings, uncomplicated abscess drainage.  You do not need to be an [place] resident. °Payment Options: °PAYMENT IS DUE AT THE TIME OF SERVICE. °Dental insurance, otherwise sliding scale - bring proof of income or support. °Depending on income and treatment needed, cost is usually $50-200. °Best way to get seen: °Arrive early as it is first come/first served. °  °  °[facility] °[phone] °  °Location: °[address] °Clinic Hours: °Mon-Thu 8a-5p °Services: °Most basic dental services including extractions and fillings. °Payment Options: °PAYMENT IS DUE AT THE TIME OF SERVICE. °Sliding scale, up to 50% off - bring proof if income or support. °Medicaid with dental option accepted. °Best way to get seen: °Call to schedule an appointment, can usually be seen within 2 weeks OR they will try to see walk-ins - show up at 8a or 2p (you may have to wait). °  °  °[facility] °[phone] °[place] RESIDENTS ONLY °  °Location: °[facility], [address], [place], [place] [address] °Clinic Hours: By appointment only. °Monday - Thursday 8am-5pm, Friday 8am-12pm °Services: Cleanings, fillings, extractions. °Payment Options: °PAYMENT IS DUE AT THE TIME OF SERVICE. °Cash, Visa or MasterCard. Sliding scale - $30 minimum per service. °Best way to get seen: °Come in to office, complete packet and make an appointment - need proof of income °or support monies for each household member and proof of [place] residence. °Usually takes about a month to get in. °  °  °[facility] °[phone] °  °Location: °[address]   [place] °Clinic Hours: Walk-in Urgent Care Dental Services are offered Monday-Friday  mornings only. °The numbers of emergencies accepted daily is limited to the number of °providers available. °Maximum 15 - Mondays, Wednesdays & Thursdays °Maximum 10 - Tuesdays & Fridays °Services: °You do not need to be a [place] resident to be seen for a dental emergency. °Emergencies are defined as pain, swelling, abnormal bleeding, or dental trauma. Walkins will receive x-rays if needed. °NOTE: Dental cleaning is not an emergency. °Payment Options: °PAYMENT IS DUE AT THE TIME OF SERVICE. °Minimum co-pay is $40.00 for uninsured patients. °Minimum co-pay is $3.00 for Medicaid with dental coverage. °Dental Insurance is accepted and must be presented at time of visit. °Medicare does not cover dental. °Forms of payment: Cash, credit card, checks. °Best way to get seen: °If not previously registered with the clinic, walk-in dental registration begins at 7:15 am and is on a first come/first serve basis. °If previously registered with the clinic, call to make an appointment. °  °  °[facility] °[phone] °[place] RESIDENTS ONLY °  °Location: °[address], [place], [place] °Clinic Hours: °Mon-Thu 10a-2p °Services: Extractions only! °Payment Options: °FREE (donations accepted) - bring proof of income or support °Best way to get seen: °Call and schedule an appointment OR come at 8am on the 1st Monday of every month (except for holidays) when it is first come/first served. °  °  °[facility] °[phone] °  °Location: °[address], [place] °Clinic Hours: °Friday mornings °Services, Payment Options, Best way to get seen: °Call for info °

## 2017-12-18 NOTE — ED Provider Notes (Signed)
[facility] Emergency Department Provider Note  ____________________________________________  Time seen: Approximately 10:08 PM  I have reviewed the triage vital signs and the nursing notes.   HISTORY  Chief Complaint Dental Pain    HPI Angela Douglas is a 32 y.o. female presents to the emergency department with inferior 30 pain for the past 2 days.  Patient has also noticed right-sided facial edema.  No fever or chills.  Patient has experienced similar symptoms in the past.  She currently rates her pain at 10 out of 10 in intensity.  Past Medical History:  Diagnosis Date  . Anemia   . Asthma   . Hypoglycemia     There are no active problems to display for this patient.   Past Surgical History:  Procedure Laterality Date  . LAPAROSCOPIC OVARIAN    . WISDOM TOOTH EXTRACTION      Prior to Admission medications   Medication Sig Start Date End Date Taking? Authorizing Provider  acetaminophen (TYLENOL) 650 MG CR tablet Take 650 mg by mouth 2 (two) times daily as needed for pain.    [provider]  amoxicillin (AMOXIL) 500 MG tablet Take 1 tablet (500 mg total) by mouth 2 (two) times daily for 10 days. 12/18/17 12/28/17  Orvil FeilWoods, Leydy Worthey M, PA-C  brompheniramine-pseudoephedrine-DM 30-2-10 MG/5ML syrup Take 5 mLs by mouth 4 (four) times daily as needed. 01/02/16   Joni ReiningSmith, Ronald K, PA-C  brompheniramine-pseudoephedrine-DM 30-2-10 MG/5ML syrup Take 5 mLs by mouth 4 (four) times daily as needed. 12/22/16   Joni ReiningSmith, Ronald K, PA-C  ibuprofen (ADVIL,MOTRIN) 200 MG tablet Take 800 mg by mouth 2 (two) times daily as needed for moderate pain.    [provider]  ibuprofen (ADVIL,MOTRIN) 800 MG tablet Take 1 tablet (800 mg total) by mouth every 8 (eight) hours as needed for moderate pain. 01/02/16   Joni ReiningSmith, Ronald K, PA-C  ketorolac (TORADOL) 10 MG tablet Take 1 tablet (10 mg total) by mouth every 6 (six) hours as needed for up to 5 days. 12/18/17 12/23/17   Orvil FeilWoods, Bessie Livingood M, PA-C  naproxen (NAPROSYN) 500 MG tablet Take 1 tablet (500 mg total) by mouth 2 (two) times daily with a meal. 03/08/17   Joni ReiningSmith, Ronald K, PA-C  traMADol (ULTRAM) 50 MG tablet Take 1 tablet (50 mg total) by mouth every 6 (six) hours as needed. 03/08/17 03/08/18  Joni ReiningSmith, Ronald K, PA-C    Allergies Banana and Latex  No family history on file.  Social History Social History   Tobacco Use  . Smoking status: Current Every Day Smoker  . Smokeless tobacco: Never Used  Substance Use Topics  . Alcohol use: Yes  . Drug use: No     Review of Systems  Constitutional: No fever/chills Eyes: No visual changes. No discharge ENT: Patient has Inferior 30 pain.  Cardiovascular: no chest pain. Respiratory: no cough. No SOB. Gastrointestinal: No abdominal pain.  No nausea, no vomiting.  No diarrhea.  No constipation. Musculoskeletal: Negative for musculoskeletal pain. Skin: Negative for rash, abrasions, lacerations, ecchymosis. Neurological: Negative for headaches, focal weakness or numbness.   ____________________________________________   PHYSICAL EXAM:  VITAL SIGNS: ED Triage Vitals  Enc Vitals Group     BP 12/18/17 2011 (!) 138/95     Pulse Rate 12/18/17 2011 67     Resp 12/18/17 2011 18     Temp 12/18/17 2011 98.9 F (37.2 C)     Temp Source 12/18/17 2011 Oral     SpO2 12/18/17 2011  100 %     Weight 12/18/17 2012 230 lb (104.3 kg)     Height 12/18/17 2012 5\' 7"  (1.702 m)     Head Circumference --      Peak Flow --      Pain Score 12/18/17 2011 8     Pain Loc --      Pain Edu? --      Excl. in GC? --      Constitutional: Alert and oriented. Well appearing and in no acute distress. Eyes: Conjunctivae are normal. PERRL. EOMI. Head: Atraumatic.  Patient has right-sided facial edema. ENT:      Ears: TMs are pearly.       Nose: No congestion/rhinnorhea.      Mouth/Throat: Mucous membranes are moist.  No dental caries  visualized. Hematological/Lymphatic/Immunilogical: No cervical lymphadenopathy. Cardiovascular: Normal rate, regular rhythm. Normal S1 and S2.  Good peripheral circulation. Respiratory: Normal respiratory effort without tachypnea or retractions. Lungs CTAB. Good air entry to the bases with no decreased or absent breath sounds. Skin:  Skin is warm, dry and intact. No rash noted.   ____________________________________________   LABS (all labs ordered are listed, but only abnormal results are displayed)  Labs Reviewed - No data to display ____________________________________________  EKG   ____________________________________________  RADIOLOGY  No results found.  ____________________________________________    PROCEDURES  Procedure(s) performed:    Procedures    Medications  ketorolac (TORADOL) 30 MG/ML injection 30 mg (30 mg Intramuscular Given 12/18/17 2108)     ____________________________________________   INITIAL IMPRESSION / ASSESSMENT AND PLAN / ED COURSE  Pertinent labs & imaging results that were available during my care of the patient were reviewed by me and considered in my medical decision making (see chart for details).  Review of the [place] CSRS was performed in accordance of the [facility] prior to dispensing any controlled drugs.     Assessment and plan Dental pain Patient presents to the emergency department with inferior 30 pain and right-sided facial edema.  Differential diagnosis included dental caries versus dental abscess.  Patient was treated empirically for dental abscess.  She was discharged with amoxicillin.  She was given Toradol in the emergency department and discharged with Toradol.  Vital signs are reassuring prior to discharge.  All patient questions were answered.    ____________________________________________  FINAL CLINICAL IMPRESSION(S) / ED DIAGNOSES  Final diagnoses:  Pain, dental      NEW MEDICATIONS STARTED DURING THIS  VISIT:  ED Discharge Orders        Ordered    amoxicillin (AMOXIL) 500 MG tablet  2 times daily     12/18/17 2101    ketorolac (TORADOL) 10 MG tablet  Every 6 hours PRN     12/18/17 2103          This chart was dictated using voice recognition software/Dragon. Despite best efforts to proofread, errors can occur which can change the meaning. Any change was purely unintentional.    Orvil Feil, PA-C 12/18/17 2211    Nita Sickle, MD 12/20/17 [phone]

## 2017-12-18 NOTE — ED Triage Notes (Signed)
Pt presents to ED with right sided tooth pain for the past 2 days. Hx of the same. Has been taking pain medication without relief.

## 2018-03-16 ENCOUNTER — Ambulatory Visit: Payer: Self-pay | Admitting: Family Medicine

## 2018-05-18 ENCOUNTER — Emergency Department
Admission: EM | Admit: 2018-05-18 | Discharge: 2018-05-18 | Disposition: A | Discharge status: Home / Self Care | Payer: Medicaid Other | Attending: Emergency Medicine | Admitting: Emergency Medicine

## 2018-05-18 ENCOUNTER — Other Ambulatory Visit: Payer: Self-pay

## 2018-05-18 ENCOUNTER — Encounter: Payer: Self-pay | Admitting: Emergency Medicine

## 2018-05-18 DIAGNOSIS — F17210 Nicotine dependence, cigarettes, uncomplicated: Secondary | ICD-10-CM | POA: Insufficient documentation

## 2018-05-18 DIAGNOSIS — J040 Acute laryngitis: Secondary | ICD-10-CM

## 2018-05-18 DIAGNOSIS — Z79899 Other long term (current) drug therapy: Secondary | ICD-10-CM | POA: Insufficient documentation

## 2018-05-18 DIAGNOSIS — J45909 Unspecified asthma, uncomplicated: Secondary | ICD-10-CM | POA: Diagnosis not present

## 2018-05-18 DIAGNOSIS — J029 Acute pharyngitis, unspecified: Secondary | ICD-10-CM | POA: Diagnosis not present

## 2018-05-18 LAB — GROUP A STREP BY PCR: GROUP A STREP BY PCR: NOT DETECTED

## 2018-05-18 MED ORDER — LIDOCAINE VISCOUS HCL 2 % MT SOLN
15.0000 mL | Freq: Once | OROMUCOSAL | Status: AC
  Administered 2018-05-18: 15 mL via OROMUCOSAL
  Filled 2018-05-18: qty 15

## 2018-05-18 MED ORDER — MAGIC MOUTHWASH W/LIDOCAINE: 5.0000 mL | Freq: Four times a day (QID) | ORAL | 0 refills | Status: DC

## 2018-05-18 MED ORDER — METHYLPREDNISOLONE 4 MG PO TBPK: ORAL_TABLET | ORAL | 0 refills | Status: DC

## 2018-05-18 MED ORDER — PSEUDOEPH-BROMPHEN-DM 30-2-10 MG/5ML PO SYRP: 5.0000 mL | ORAL_SOLUTION | Freq: Four times a day (QID) | ORAL | 0 refills | Status: DC | PRN

## 2018-05-18 MED ORDER — DIPHENHYDRAMINE HCL 12.5 MG/5ML PO ELIX
12.5000 mg | ORAL_SOLUTION | Freq: Once | ORAL | Status: AC
  Administered 2018-05-18: 12.5 mg via ORAL
  Filled 2018-05-18: qty 5

## 2018-05-18 NOTE — ED Triage Notes (Signed)
Pt to ED from home c/o sore throat since 10pm last night.  Patient states left ear pain, chills at home but has not checked temperature.  Has taken dayquil and allergy relief at home this morning. White coating noticed to left tonsil.

## 2018-05-18 NOTE — ED Provider Notes (Signed)
[facility] Emergency Department Provider Note   ____________________________________________   First MD Initiated Contact with Patient 05/18/18 1407     (approximate)  I have reviewed the triage vital signs and the nursing notes.   HISTORY  Chief Complaint Sore Throat    HPI Angela Douglas is a 32 y.o. female patient complained of sore throat which began last night.  Patient also complaining of left ear pain/pressure.  Patient had no relief with taking DayQuil and allergy medicine this morning.  Patient that she noticed a white coating to the left tonsil.  Patient unsure of fever.  Past Medical History:  Diagnosis Date  . Anemia   . Asthma   . Hypoglycemia     There are no active problems to display for this patient.   Past Surgical History:  Procedure Laterality Date  . LAPAROSCOPIC OVARIAN    . WISDOM TOOTH EXTRACTION      Prior to Admission medications   Medication Sig Start Date End Date Taking? Authorizing Provider  acetaminophen (TYLENOL) 650 MG CR tablet Take 650 mg by mouth 2 (two) times daily as needed for pain.    [provider]  brompheniramine-pseudoephedrine-DM 30-2-10 MG/5ML syrup Take 5 mLs by mouth 4 (four) times daily as needed. 01/02/16   Joni Reining, PA-C  brompheniramine-pseudoephedrine-DM 30-2-10 MG/5ML syrup Take 5 mLs by mouth 4 (four) times daily as needed. 12/22/16   Joni Reining, PA-C  brompheniramine-pseudoephedrine-DM 30-2-10 MG/5ML syrup Take 5 mLs by mouth 4 (four) times daily as needed. 05/18/18   Joni Reining, PA-C  ibuprofen (ADVIL,MOTRIN) 200 MG tablet Take 800 mg by mouth 2 (two) times daily as needed for moderate pain.    [provider]  ibuprofen (ADVIL,MOTRIN) 800 MG tablet Take 1 tablet (800 mg total) by mouth every 8 (eight) hours as needed for moderate pain. 01/02/16   Joni Reining, PA-C  magic mouthwash w/lidocaine SOLN Take 5 mLs by mouth 4 (four) times daily. 05/18/18    Joni Reining, PA-C  methylPREDNISolone (MEDROL DOSEPAK) 4 MG TBPK tablet Take Tapered dose as directed 05/18/18   Joni Reining, PA-C  naproxen (NAPROSYN) 500 MG tablet Take 1 tablet (500 mg total) by mouth 2 (two) times daily with a meal. 03/08/17   Joni Reining, PA-C    Allergies Banana and Latex  History reviewed. No pertinent family history.  Social History Social History   Tobacco Use  . Smoking status: Current Every Day Smoker    Packs/day: 0.25    Types: Cigarettes  . Smokeless tobacco: Never Used  Substance Use Topics  . Alcohol use: Yes  . Drug use: No    Review of Systems Constitutional: No fever/chills Eyes: No visual changes. ENT: Sore throat and ear pressure.. Cardiovascular: Denies chest pain. Respiratory: Denies shortness of breath. Gastrointestinal: No abdominal pain.  No nausea, no vomiting.  No diarrhea.  No constipation. Genitourinary: Negative for dysuria. Musculoskeletal: Negative for back pain. Skin: Negative for rash. Neurological: Negative for headaches, focal weakness or numbness. Allergic/Immunilogical: Bananas and latex ____________________________________________   PHYSICAL EXAM:  VITAL SIGNS: ED Triage Vitals  Enc Vitals Group     BP 05/18/18 1346 126/80     Pulse Rate 05/18/18 1346 81     Resp 05/18/18 1346 20     Temp 05/18/18 1346 99.2 F (37.3 C)     Temp Source 05/18/18 1346 Oral     SpO2 05/18/18 1346 100 %  Weight 05/18/18 1347 220 lb (99.8 kg)     Height 05/18/18 1347 5\' 7"  (1.702 m)     Head Circumference --      Peak Flow --      Pain Score 05/18/18 1350 8     Pain Loc --      Pain Edu? --      Excl. in GC? --    Constitutional: Alert and oriented. Well appearing and in no acute distress. Nose: No congestion/rhinnorhea. Mouth/Throat: Mucous membranes are moist.  Oropharynx erythematous.  Edematous nonexudative tonsils.  Decreased voice volume. Neck: No stridor.  No cervical spine tenderness to  palpation. Hematological/Lymphatic/Immunilogical: No cervical lymphadenopathy. Cardiovascular: Normal rate, regular rhythm. Grossly normal heart sounds.  Good peripheral circulation. Respiratory: Normal respiratory effort.  No retractions. Lungs CTAB. Gastrointestinal: Soft and nontender. No distention. No abdominal bruits. No CVA tenderness. Musculoskeletal: No lower extremity tenderness nor edema.  No joint effusions. Neurologic:  Normal speech and language. No gross focal neurologic deficits are appreciated. No gait instability. Skin:  Skin is warm, dry and intact. No rash noted. Psychiatric: Mood and affect are normal. Speech and behavior are normal.  ____________________________________________   LABS (all labs ordered are listed, but only abnormal results are displayed)  Labs Reviewed  GROUP A STREP BY PCR   ____________________________________________  EKG   ____________________________________________  RADIOLOGY  ED MD interpretation:    Official radiology report(s): No results found.  ____________________________________________   PROCEDURES  Procedure(s) performed:   Procedures  Critical Care performed: No  ____________________________________________   INITIAL IMPRESSION / ASSESSMENT AND PLAN / ED COURSE  As part of my medical decision making, I reviewed the following data within the electronic MEDICAL RECORD NUMBER    Laryngitis and pharyngitis secondary to viral illness.  Discussed negative rapid strep results with patient.  Patient given discharge care instruction advised take medication as directed.  Patient given a work note and advised to follow-up with the community health clinic if condition persist.      ____________________________________________   FINAL CLINICAL IMPRESSION(S) / ED DIAGNOSES  Final diagnoses:  Pharyngitis, unspecified etiology  Laryngitis     ED Discharge Orders        Ordered    magic mouthwash w/lidocaine SOLN   4 times daily     05/18/18 1539    methylPREDNISolone (MEDROL DOSEPAK) 4 MG TBPK tablet     05/18/18 1539    brompheniramine-pseudoephedrine-DM 30-2-10 MG/5ML syrup  4 times daily PRN     05/18/18 1539       Note:  This document was prepared using Dragon voice recognition software and may include unintentional dictation errors.    Joni ReiningSmith, Ronald K, PA-C 05/18/18 1558    Jene EveryKinner, Robert, MD 05/19/18 [phone]

## 2018-05-18 NOTE — ED Notes (Signed)
See triage note  Presents with sore throat and ear pain since last pm   Subjective fever at home low grade on arrival

## 2018-06-17 ENCOUNTER — Ambulatory Visit: Payer: Medicaid Other | Admitting: Family Medicine

## 2018-06-26 ENCOUNTER — Other Ambulatory Visit: Payer: Self-pay

## 2018-06-26 ENCOUNTER — Other Ambulatory Visit: Payer: Self-pay | Admitting: Family Medicine

## 2018-06-26 ENCOUNTER — Encounter: Payer: Self-pay | Admitting: Family Medicine

## 2018-06-26 ENCOUNTER — Ambulatory Visit (INDEPENDENT_AMBULATORY_CARE_PROVIDER_SITE_OTHER): Payer: Medicaid Other | Admitting: Family Medicine

## 2018-06-26 VITALS — BP 116/83 | HR 66 | Temp 98.8°F | Ht 66.5 in | Wt 259.3 lb

## 2018-06-26 DIAGNOSIS — A549 Gonococcal infection, unspecified: Secondary | ICD-10-CM

## 2018-06-26 DIAGNOSIS — G43009 Migraine without aura, not intractable, without status migrainosus: Secondary | ICD-10-CM | POA: Diagnosis not present

## 2018-06-26 DIAGNOSIS — Z7689 Persons encountering health services in other specified circumstances: Secondary | ICD-10-CM

## 2018-06-26 DIAGNOSIS — Z23 Encounter for immunization: Secondary | ICD-10-CM

## 2018-06-26 DIAGNOSIS — E119 Type 2 diabetes mellitus without complications: Secondary | ICD-10-CM

## 2018-06-26 DIAGNOSIS — J4520 Mild intermittent asthma, uncomplicated: Secondary | ICD-10-CM | POA: Diagnosis not present

## 2018-06-26 DIAGNOSIS — Z114 Encounter for screening for human immunodeficiency virus [HIV]: Secondary | ICD-10-CM

## 2018-06-26 DIAGNOSIS — Z113 Encounter for screening for infections with a predominantly sexual mode of transmission: Secondary | ICD-10-CM

## 2018-06-26 MED ORDER — AZITHROMYCIN 500 MG PO TABS: 1000.0000 mg | ORAL_TABLET | Freq: Every day | ORAL | 0 refills | Status: DC

## 2018-06-26 MED ORDER — PANTOPRAZOLE SODIUM 40 MG PO TBEC: 40.0000 mg | DELAYED_RELEASE_TABLET | Freq: Every day | ORAL | 3 refills | Status: DC

## 2018-06-26 MED ORDER — ALBUTEROL SULFATE HFA 108 (90 BASE) MCG/ACT IN AERS: 2.0000 | INHALATION_SPRAY | Freq: Four times a day (QID) | RESPIRATORY_TRACT | 11 refills | Status: AC | PRN

## 2018-06-26 MED ORDER — ONDANSETRON 4 MG PO TBDP: 4.0000 mg | ORAL_TABLET | Freq: Three times a day (TID) | ORAL | 0 refills | Status: AC | PRN

## 2018-06-26 MED ORDER — CEFTRIAXONE SODIUM 250 MG IJ SOLR
250.0000 mg | Freq: Once | INTRAMUSCULAR | Status: AC
  Administered 2018-06-26: 250 mg via INTRAMUSCULAR

## 2018-06-26 MED ORDER — SUMATRIPTAN SUCCINATE 50 MG PO TABS: ORAL_TABLET | ORAL | 0 refills | Status: DC

## 2018-06-26 NOTE — Progress Notes (Signed)
BP 116/83   Pulse 66   Temp 98.8 F (37.1 C) (Oral)   Ht 5' 6.5" (1.689 m)   Wt 259 lb 4.8 oz (117.6 kg)   SpO2 98%   BMI 41.23 kg/m    Subjective:    Patient ID: Angela Douglas, female    DOB: January 06, 1986, 32 y.o.   MRN: [id number]  HPI: Angela Douglas is a 32 y.o. female  Chief Complaint  Patient presents with  . New Patient (Initial Visit)    pt states that she had an abnormal STI test recently and would like to talk about it.   Marland Kitchen. Headache    pt states she would like to have something for headaches/migraine   Pt here today to establish care.   [facility] saying she was positive for gonorrhea. Boyfriend cheated on her recently so she got tested. Asymptomatic. States she can't get out to the [facility] very easily and would like to get treated here for it. Does not think they did comprehensive STI testing at this encounter.   Migraines - getting about 4/month. Worst ones are lasting about 12 days straight. Significant light and sound sensitivity, N/V. Taking tylenol and ibuprofen when having them with some relief. Long personal hx of these as well as family hx in many family members. Has never been on anything prescription for them.   Hx of diabetes. States it started as just gestational diabetes. Does have frequent episodes of hypoglycemia. Has not had labs in a long time. Does not watch diet closely. Does not check home BSs.   Hx of asthma, mainly exercise or illness induced. Has not had an inhaler for a long time.   Relevant past medical, surgical, family and social history reviewed and updated as indicated. Interim medical history since our last visit reviewed. Allergies and medications reviewed and updated.  Review of Systems  Per HPI unless specifically indicated above     Objective:    BP 116/83   Pulse 66   Temp 98.8 F (37.1 C) (Oral)   Ht 5' 6.5" (1.689 m)   Wt 259 lb 4.8 oz (117.6 kg)   SpO2 98%   BMI 41.23 kg/m     Wt Readings from Last 3 Encounters:  06/26/18 259 lb 4.8 oz (117.6 kg)  05/18/18 220 lb (99.8 kg)  12/18/17 230 lb (104.3 kg)    Physical Exam  Constitutional: She is oriented to person, place, and time. She appears well-developed and well-nourished. No distress.  HENT:  Head: Atraumatic.  Eyes: Conjunctivae and EOM are normal.  Neck: Normal range of motion. Neck supple.  Cardiovascular: Normal rate and regular rhythm.  Pulmonary/Chest: Effort normal and breath sounds normal.  Musculoskeletal: Normal range of motion.  Neurological: She is alert and oriented to person, place, and time.  Skin: Skin is warm and dry.  Psychiatric: She has a normal mood and affect. Her behavior is normal.  Nursing note and vitals reviewed.   Results for orders placed or performed in visit on 06/26/18  HIV antibody  Result Value Ref Range   HIV Screen 4th Generation wRfx Non Reactive Non Reactive  HIV antibody  Result Value Ref Range   HIV Screen 4th Generation wRfx Non Reactive Non Reactive  RPR  Result Value Ref Range   RPR Ser Ql Reactive (A) Non Reactive  HSV(herpes simplex vrs) 1+2 ab-IgG  Result Value Ref Range   HSV 1 Glycoprotein G Ab, IgG WILL FOLLOW  HSV 2 IgG, Type Spec WILL FOLLOW    HSV-2 IgG Supplemental Test WILL FOLLOW   Hepatitis C antibody  Result Value Ref Range   Hep C Virus Ab <0.1 0.0 - 0.9 s/co ratio  Hepatitis B Surface AntiGEN  Result Value Ref Range   Hepatitis B Surface Ag Negative Negative  RPR, quant & T.pallidum antibodies  Result Value Ref Range   Rapid Plasma Reagin, Quant 1:2 (H) NonRea<1:1   T Pallidum Abs WILL FOLLOW       Assessment & Plan:   Problem List Items Addressed This Visit      Cardiovascular and Mediastinum   Migraine - Primary    Will start imitrex in addition to OTC remedies. If still having poor control, will add a daily medication for prevention. Zofran sent for prn nausea/vomiting with her migraines      Relevant Medications    SUMAtriptan (IMITREX) 50 MG tablet     Respiratory   Asthma    Albuterol inhaler sent for prn use      Relevant Medications   albuterol (PROVENTIL HFA;VENTOLIN HFA) 108 (90 Base) MCG/ACT inhaler     Endocrine   Diabetes mellitus without complication (HCC)    Diet controlled. Will get A1C at upcoming CPE and tx as needed. Work on [employer]the[employer]weight loss/lifestyle modifications in meantime       Other Visit Diagnoses    Encounter to establish care       Encounter for screening for HIV       Relevant Orders   HIV antibody (Completed)   Need for Tdap vaccination       Relevant Orders   Tdap vaccine greater than or equal to 7yo IM (Completed)   Gonorrhea       Per pt, tested positive through health dept. Rocephin given today, azithromycin sent. Will do full panel of STI testing today and obtain these records   Relevant Medications   azithromycin (ZITHROMAX) 500 MG tablet   cefTRIAXone (ROCEPHIN) injection 250 mg (Completed)   Flu vaccine need       Relevant Orders   Flu Vaccine QUAD 36+ mos IM (Completed)   Routine screening for STI (sexually transmitted infection)       Relevant Orders   HIV antibody (Completed)   RPR (Completed)   HSV(herpes simplex vrs) 1+2 ab-IgG (Completed)   Hepatitis C antibody (Completed)   Hepatitis B Surface AntiGEN (Completed)   GC/Chlamydia Probe Amp       Follow up plan: Return in about 4 weeks (around 07/24/2018) for CPE.

## 2018-06-26 NOTE — Patient Instructions (Signed)
Tdap Vaccine (Tetanus, Diphtheria and Pertussis): What You Need to Know 1. Why get vaccinated? Tetanus, diphtheria and pertussis are very serious diseases. Tdap vaccine can protect us from these diseases. And, Tdap vaccine given to pregnant women can protect newborn babies against pertussis. TETANUS (Lockjaw) is rare in the [place] today. It causes painful muscle tightening and stiffness, usually all over the body.  It can lead to tightening of muscles in the head and neck so you can't open your mouth, swallow, or sometimes even breathe. Tetanus kills about 1 out of 10 people who are infected even after receiving the best medical care.  DIPHTHERIA is also rare in the [place] today. It can cause a thick coating to form in the back of the throat.  It can lead to breathing problems, heart failure, paralysis, and death.  PERTUSSIS (Whooping Cough) causes severe coughing spells, which can cause difficulty breathing, vomiting and disturbed sleep.  It can also lead to weight loss, incontinence, and rib fractures. Up to 2 in 100 adolescents and 5 in 100 adults with pertussis are hospitalized or have complications, which could include pneumonia or death.  These diseases are caused by bacteria. Diphtheria and pertussis are spread from person to person through secretions from coughing or sneezing. Tetanus enters the body through cuts, scratches, or wounds. Before vaccines, as many as 200,000 cases of diphtheria, 200,000 cases of pertussis, and hundreds of cases of tetanus, were reported in the [place] each year. Since vaccination began, reports of cases for tetanus and diphtheria have dropped by about 99% and for pertussis by about 80%. 2. Tdap vaccine Tdap vaccine can protect adolescents and adults from tetanus, diphtheria, and pertussis. One dose of Tdap is routinely given at age 11 or 12. People who did not get Tdap at that age should get it as soon as possible. Tdap is especially  important for healthcare professionals and anyone having close contact with a baby younger than 12 months. Pregnant women should get a dose of Tdap during every pregnancy, to protect the newborn from pertussis. Infants are most at risk for severe, life-threatening complications from pertussis. Another vaccine, called Td, protects against tetanus and diphtheria, but not pertussis. A Td booster should be given every 10 years. Tdap may be given as one of these boosters if you have never gotten Tdap before. Tdap may also be given after a severe cut or burn to prevent tetanus infection. Your doctor or the person giving you the vaccine can give you more information. Tdap may safely be given at the same time as other vaccines. 3. Some people should not get this vaccine  A person who has ever had a life-threatening allergic reaction after a previous dose of any diphtheria, tetanus or pertussis containing vaccine, OR has a severe allergy to any part of this vaccine, should not get Tdap vaccine. Tell the person giving the vaccine about any severe allergies.  Anyone who had coma or long repeated seizures within 7 days after a childhood dose of DTP or DTaP, or a previous dose of Tdap, should not get Tdap, unless a cause other than the vaccine was found. They can still get Td.  Talk to your doctor if you: ? have seizures or another nervous system problem, ? had severe pain or swelling after any vaccine containing diphtheria, tetanus or pertussis, ? ever had a condition called Guillain-Barr Syndrome (GBS), ? aren't feeling well on the day the shot is scheduled. 4. Risks With any medicine, including   vaccines, there is a chance of side effects. These are usually mild and go away on their own. Serious reactions are also possible but are rare. Most people who get Tdap vaccine do not have any problems with it. Mild problems following Tdap: (Did not interfere with activities)  Pain where the shot was given (about  3 in 4 adolescents or 2 in 3 adults)  Redness or swelling where the shot was given (about 1 person in 5)  Mild fever of at least 100.4F (up to about 1 in 25 adolescents or 1 in 100 adults)  Headache (about 3 or 4 people in 10)  Tiredness (about 1 person in 3 or 4)  Nausea, vomiting, diarrhea, stomach ache (up to 1 in 4 adolescents or 1 in 10 adults)  Chills, sore joints (about 1 person in 10)  Body aches (about 1 person in 3 or 4)  Rash, swollen glands (uncommon)  Moderate problems following Tdap: (Interfered with activities, but did not require medical attention)  Pain where the shot was given (up to 1 in 5 or 6)  Redness or swelling where the shot was given (up to about 1 in 16 adolescents or 1 in 12 adults)  Fever over 102F (about 1 in 100 adolescents or 1 in 250 adults)  Headache (about 1 in 7 adolescents or 1 in 10 adults)  Nausea, vomiting, diarrhea, stomach ache (up to 1 or 3 people in 100)  Swelling of the entire arm where the shot was given (up to about 1 in 500).  Severe problems following Tdap: (Unable to perform usual activities; required medical attention)  Swelling, severe pain, bleeding and redness in the arm where the shot was given (rare).  Problems that could happen after any vaccine:  People sometimes faint after a medical procedure, including vaccination. Sitting or lying down for about 15 minutes can help prevent fainting, and injuries caused by a fall. Tell your doctor if you feel dizzy, or have vision changes or ringing in the ears.  Some people get severe pain in the shoulder and have difficulty moving the arm where a shot was given. This happens very rarely.  Any medication can cause a severe allergic reaction. Such reactions from a vaccine are very rare, estimated at fewer than 1 in a million doses, and would happen within a few minutes to a few hours after the vaccination. As with any medicine, there is a very remote chance of a vaccine  causing a serious injury or death. The safety of vaccines is always being monitored. For more information, visit: www.cdc.gov/vaccinesafety/ 5. What if there is a serious problem? What should I look for? Look for anything that concerns you, such as signs of a severe allergic reaction, very high fever, or unusual behavior. Signs of a severe allergic reaction can include hives, swelling of the face and throat, difficulty breathing, a fast heartbeat, dizziness, and weakness. These would usually start a few minutes to a few hours after the vaccination. What should I do?  If you think it is a severe allergic reaction or other emergency that can't wait, call 9-1-1 or get the person to the nearest hospital. Otherwise, call your doctor.  Afterward, the reaction should be reported to the Vaccine Adverse Event Reporting System (VAERS). Your doctor might file this report, or you can do it yourself through the VAERS web site at www.vaers.hhs.gov, or by calling 1-800-822-7967. ? VAERS does not give medical advice. 6. The National Vaccine Injury Compensation Program The National   Vaccine Injury Compensation Program (VICP) is a federal program that was created to compensate people who may have been injured by certain vaccines. Persons who believe they may have been injured by a vaccine can learn about the program and about filing a claim by calling 1-800-338-2382 or visiting the VICP website at www.hrsa.gov/vaccinecompensation. There is a time limit to file a claim for compensation. 7. How can I learn more?  Ask your doctor. He or she can give you the vaccine package insert or suggest other sources of information.  Call your local or state health department.  Contact the Centers for Disease Control and Prevention (CDC): ? Call 1-800-232-4636 (1-800-CDC-INFO) or ? Visit CDC's website at www.cdc.gov/vaccines CDC Tdap Vaccine VIS (01/04/14) This information is not intended to replace advice given to you by your  health care provider. Make sure you discuss any questions you have with your health care provider. Document Released: 04/28/2012 Document Revised: 07/18/2016 Document Reviewed: 07/18/2016 Elsevier Interactive Patient Education  2017 [employer]vier Inc.  

## 2018-06-27 LAB — HIV ANTIBODY (ROUTINE TESTING W REFLEX): HIV Screen 4th Generation wRfx: NONREACTIVE

## 2018-06-28 LAB — GC/CHLAMYDIA PROBE AMP
Chlamydia trachomatis, NAA: NEGATIVE
NEISSERIA GONORRHOEAE BY PCR: NEGATIVE

## 2018-06-29 ENCOUNTER — Telehealth: Payer: Self-pay | Admitting: Family Medicine

## 2018-06-29 DIAGNOSIS — R7301 Impaired fasting glucose: Secondary | ICD-10-CM | POA: Insufficient documentation

## 2018-06-29 DIAGNOSIS — J45909 Unspecified asthma, uncomplicated: Secondary | ICD-10-CM | POA: Insufficient documentation

## 2018-06-29 DIAGNOSIS — G43909 Migraine, unspecified, not intractable, without status migrainosus: Secondary | ICD-10-CM | POA: Insufficient documentation

## 2018-06-29 NOTE — Telephone Encounter (Signed)
Message relayed to patient. Verbalized understanding and denied questions.   

## 2018-06-29 NOTE — Assessment & Plan Note (Addendum)
Will start imitrex in addition to OTC remedies. If still having poor control, will add a daily medication for prevention. Zofran sent for prn nausea/vomiting with her migraines

## 2018-06-29 NOTE — Assessment & Plan Note (Signed)
Albuterol inhaler sent for prn use

## 2018-06-29 NOTE — Assessment & Plan Note (Signed)
Diet controlled. Will get A1C at upcoming CPE and tx as needed. Work on Raytheonweight loss/lifestyle modifications in meantime

## 2018-06-29 NOTE — Telephone Encounter (Signed)
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 She will need to [facility] to the [facility] to treat the syphilis and should call us if she notices any cold sores around mouth or private areas

## 2018-06-30 LAB — HEPATITIS C ANTIBODY

## 2018-06-30 LAB — RPR, QUANT+TP ABS (REFLEX)
Rapid Plasma Reagin, Quant: 1:2 {titer} — ABNORMAL HIGH
T Pallidum Abs: NEGATIVE

## 2018-06-30 LAB — HSV(HERPES SIMPLEX VRS) I + II AB-IGG
HSV 1 Glycoprotein G Ab, IgG: 16.6 index — ABNORMAL HIGH (ref 0.00–0.90)
HSV 2 IGG, TYPE SPEC: 1.04 {index} — AB (ref 0.00–0.90)

## 2018-06-30 LAB — RPR: RPR: REACTIVE — AB

## 2018-06-30 LAB — HEPATITIS B SURFACE ANTIGEN: Hepatitis B Surface Ag: NEGATIVE

## 2018-06-30 LAB — HSV-2 IGG SUPPLEMENTAL TEST: HSV-2 IGG SUPPLEMENTAL TEST: NEGATIVE

## 2018-06-30 LAB — HIV ANTIBODY (ROUTINE TESTING W REFLEX): HIV Screen 4th Generation wRfx: NONREACTIVE

## 2018-07-21 ENCOUNTER — Encounter: Payer: Medicaid Other | Admitting: Family Medicine

## 2018-08-07 ENCOUNTER — Ambulatory Visit (INDEPENDENT_AMBULATORY_CARE_PROVIDER_SITE_OTHER): Payer: Medicaid Other | Admitting: Family Medicine

## 2018-08-07 ENCOUNTER — Other Ambulatory Visit: Payer: Self-pay

## 2018-08-07 ENCOUNTER — Encounter: Payer: Self-pay | Admitting: Family Medicine

## 2018-08-07 VITALS — BP 121/86 | HR 89 | Temp 98.6°F | Ht 67.0 in | Wt 258.0 lb

## 2018-08-07 DIAGNOSIS — J4520 Mild intermittent asthma, uncomplicated: Secondary | ICD-10-CM | POA: Diagnosis not present

## 2018-08-07 DIAGNOSIS — A539 Syphilis, unspecified: Secondary | ICD-10-CM | POA: Diagnosis not present

## 2018-08-07 DIAGNOSIS — Z0000 Encounter for general adult medical examination without abnormal findings: Secondary | ICD-10-CM | POA: Diagnosis not present

## 2018-08-07 DIAGNOSIS — Z6841 Body Mass Index (BMI) 40.0 and over, adult: Secondary | ICD-10-CM

## 2018-08-07 DIAGNOSIS — G43009 Migraine without aura, not intractable, without status migrainosus: Secondary | ICD-10-CM | POA: Diagnosis not present

## 2018-08-07 DIAGNOSIS — E119 Type 2 diabetes mellitus without complications: Secondary | ICD-10-CM

## 2018-08-07 LAB — UA/M W/RFLX CULTURE, ROUTINE
BILIRUBIN UA: NEGATIVE
Glucose, UA: NEGATIVE
Ketones, UA: NEGATIVE
Nitrite, UA: NEGATIVE
PH UA: 6.5 (ref 5.0–7.5)
PROTEIN UA: NEGATIVE
RBC UA: NEGATIVE
Specific Gravity, UA: 1.02 (ref 1.005–1.030)
UUROB: 0.2 mg/dL (ref 0.2–1.0)

## 2018-08-07 LAB — MICROSCOPIC EXAMINATION: RBC, UA: NONE SEEN /hpf (ref 0–2)

## 2018-08-07 LAB — MICROALBUMIN, URINE WAIVED
Creatinine, Urine Waived: 200 mg/dL (ref 10–300)
MICROALB, UR WAIVED: 30 mg/L — AB (ref 0–19)
Microalb/Creat Ratio: <30 mg/g (ref <30)

## 2018-08-07 MED ORDER — NORTRIPTYLINE HCL 10 MG PO CAPS: 10.0000 mg | ORAL_CAPSULE | Freq: Every day | ORAL | 1 refills | Status: DC

## 2018-08-07 MED ORDER — SUMATRIPTAN SUCCINATE 50 MG PO TABS: ORAL_TABLET | ORAL | 6 refills | Status: DC

## 2018-08-07 NOTE — Progress Notes (Signed)
BP 121/86   Pulse 89   Temp 98.6 F (37 C) (Oral)   Ht 5\' 7"  (1.702 m)   Wt 258 lb (117 kg)   SpO2 97%   BMI 40.41 kg/m    Subjective:    Patient ID: Angela Douglas, female    DOB: 02-06-1986, 32 y.o.   MRN: [id number]  HPI: Angela Douglas is a 32 y.o. female presenting on 08/07/2018 for comprehensive medical examination. Current medical complaints include:see below  Still getting migraines pretty frequently, but the imitrex helps for the most part. Frequency is about 10 days per month. Taking OTC pain relievers for them as well with some relief.   Breathing stable on prn albuterol. No recent exacerbations of her asthma.   Blood sugars currently diet controlled, unclear what baseline A1C is. No low blood sugar spells, polyuria, polydipsia.   [facility] Will obtain records.   Positive syphilis test at establish care visit over a month ago, Patient states she has not gotten treatment because she does not have a ride [facility]. Denies new sexual partners since dx. No noted chancre, flu like sxs, rash.   She currently lives with: Menopausal Symptoms: no  Depression Screen done today and results listed below:  Depression screen [facility] 2/9 08/07/2018 06/26/2018  Decreased Interest 2 3  Down, Depressed, Hopeless 3 3  PHQ - 2 Score 5 6  Altered sleeping 3 3  Tired, decreased energy 3 3  Change in appetite 3 3  Feeling bad or failure about yourself  2 2  Trouble concentrating 0 1  Moving slowly or fidgety/restless 1 2  Suicidal thoughts 0 1  PHQ-9 Score 17 21    The patient does not have a history of falls. I did not complete a risk assessment for falls. A plan of care for falls was not documented.   Past Medical History:  Past Medical History:  Diagnosis Date  . Anemia   . Asthma   . Diabetes mellitus without complication (HCC)   . Hypoglycemia     Surgical History:  Past Surgical History:  Procedure Laterality Date    . LAPAROSCOPIC OVARIAN    . WISDOM TOOTH EXTRACTION      Medications:  Current Outpatient Medications on File Prior to Visit  Medication Sig  . acetaminophen (TYLENOL) 650 MG CR tablet Take 650 mg by mouth 2 (two) times daily as needed for pain.  Marland Kitchen albuterol (PROVENTIL HFA;VENTOLIN HFA) 108 (90 Base) MCG/ACT inhaler Inhale 2 puffs into the lungs every 6 (six) hours as needed for wheezing or shortness of breath.  Marland Kitchen ibuprofen (ADVIL,MOTRIN) 200 MG tablet Take 800 mg by mouth 2 (two) times daily as needed for moderate pain.  Marland Kitchen ondansetron (ZOFRAN ODT) 4 MG disintegrating tablet Take 1 tablet (4 mg total) by mouth every 8 (eight) hours as needed.  . pantoprazole (PROTONIX) 40 MG tablet Take 1 tablet (40 mg total) by mouth daily.   No current facility-administered medications on file prior to visit.     Allergies:  Allergies  Allergen Reactions  . Banana Anaphylaxis  . Latex Hives and Swelling    Social History:  Social History   Socioeconomic History  . Marital status: Married    Spouse name: Not on file  . Number of children: Not on file  . Years of education: Not on file  . Highest education level: Not on file  Occupational History  . Not  on file  Social Needs  . Financial resource strain: Not on file  . Food insecurity:    Worry: Not on file    Inability: Not on file  . Transportation needs:    Medical: Not on file    Non-medical: Not on file  Tobacco Use  . Smoking status: Current Every Day Smoker    Packs/day: 0.25    Types: Cigarettes  . Smokeless tobacco: Never Used  Substance and Sexual Activity  . Alcohol use: Yes  . Drug use: No  . Sexual activity: Not on file  Lifestyle  . Physical activity:    Days per week: Not on file    Minutes per session: Not on file  . Stress: Not on file  Relationships  . Social connections:    Talks on phone: Not on file    Gets together: Not on file    Attends religious service: Not on file    Active member of club or  organization: Not on file    Attends meetings of clubs or organizations: Not on file    Relationship status: Not on file  . Intimate partner violence:    Fear of current or ex partner: Not on file    Emotionally abused: Not on file    Physically abused: Not on file    Forced sexual activity: Not on file  Other Topics Concern  . Not on file  Social History Narrative  . Not on file   Social History   Tobacco Use  Smoking Status Current Every Day Smoker  . Packs/day: 0.25  . Types: Cigarettes  Smokeless Tobacco Never Used   Social History   Substance and Sexual Activity  Alcohol Use Yes    Family History:  Family History  Problem Relation Age of Onset  . Hypertension Mother   . Anemia Mother   . Migraines Mother   . Blindness Mother   . Obesity Mother   . Hypertension Father   . Heart disease Father   . Obesity Brother   . Cancer Maternal Aunt   . Cancer Maternal Grandmother     Past medical history, surgical history, medications, allergies, family history and social history reviewed with patient today and changes made to appropriate areas of the chart.   Review of Systems - General ROS: negative Psychological ROS: negative Ophthalmic ROS: negative ENT ROS: negative Allergy and Immunology ROS: negative Hematological and Lymphatic ROS: negative Endocrine ROS: negative Breast ROS: negative for breast lumps Respiratory ROS: no cough, shortness of breath, or wheezing Cardiovascular ROS: no chest pain or dyspnea on exertion Gastrointestinal ROS: no abdominal pain, change in bowel habits, or black or bloody stools Genito-Urinary ROS: no dysuria, trouble voiding, or hematuria Musculoskeletal ROS: negative Neurological ROS: positive for - headaches Dermatological ROS: negative All other ROS negative except what is listed above and in the HPI.      Objective:    BP 121/86   Pulse 89   Temp 98.6 F (37 C) (Oral)   Ht 5\' 7"  (1.702 m)   Wt 258 lb (117 kg)   SpO2  97%   BMI 40.41 kg/m   Wt Readings from Last 3 Encounters:  08/07/18 258 lb (117 kg)  06/26/18 259 lb 4.8 oz (117.6 kg)  05/18/18 220 lb (99.8 kg)    Physical Exam  Constitutional: She is oriented to person, place, and time. She appears well-developed and well-nourished. No distress.  HENT:  Head: Atraumatic.  Right Ear: External ear normal.  Left Ear: External ear normal.  Nose: Nose normal.  Mouth/Throat: Oropharynx is clear and moist. No oropharyngeal exudate.  Eyes: Pupils are equal, round, and reactive to light. Conjunctivae are normal. No scleral icterus.  Neck: Normal range of motion. Neck supple. No thyromegaly present.  Cardiovascular: Normal rate, regular rhythm, normal heart sounds and intact distal pulses.  Pulmonary/Chest: Effort normal and breath sounds normal. No respiratory distress. Right breast exhibits no mass, no skin change and no tenderness. Left breast exhibits no mass, no skin change and no tenderness.  Abdominal: Soft. Bowel sounds are normal. She exhibits no mass. There is no tenderness.  Musculoskeletal: Normal range of motion. She exhibits no edema or tenderness.  Lymphadenopathy:    She has no cervical adenopathy.  Neurological: She is alert and oriented to person, place, and time. No cranial nerve deficit.  Skin: Skin is warm and dry. No rash noted.  Psychiatric: She has a normal mood and affect. Her behavior is normal.  Nursing note and vitals reviewed.   Diabetic Foot Exam - Simple   Simple Foot Form Diabetic Foot exam was performed with the following findings:  Yes 08/07/2018 10:00 AM  Visual Inspection No deformities, no ulcerations, no other skin breakdown bilaterally:  Yes Sensation Testing Intact to touch and monofilament testing bilaterally:  Yes Pulse Check Posterior Tibialis and Dorsalis pulse intact bilaterally:  Yes Comments     Results for orders placed or performed in visit on 08/07/18  Microscopic Examination  Result Value Ref  Range   WBC, UA 0-5 0 - 5 /hpf   RBC, UA None seen 0 - 2 /hpf   Epithelial Cells (non renal) 0-10 0 - 10 /hpf   Bacteria, UA Few (A) None seen/Few  CBC with Differential/Platelet  Result Value Ref Range   WBC 8.6 3.4 - 10.8 x10E3/uL   RBC 4.66 3.77 - 5.28 x10E6/uL   Hemoglobin 13.0 11.1 - 15.9 g/dL   Hematocrit 16.1 09.6 - 46.6 %   MCV 85 79 - 97 fL   MCH 27.9 26.6 - 33.0 pg   MCHC 32.7 31.5 - 35.7 g/dL   RDW 04.5 40.9 - 81.1 %   Platelets 245 150 - 450 x10E3/uL   Neutrophils 57 Not Estab. %   Lymphs 29 Not Estab. %   Monocytes 9 Not Estab. %   Eos 4 Not Estab. %   Basos 1 Not Estab. %   Neutrophils Absolute 5.0 1.4 - 7.0 x10E3/uL   Lymphocytes Absolute 2.4 0.7 - 3.1 x10E3/uL   Monocytes Absolute 0.7 0.1 - 0.9 x10E3/uL   EOS (ABSOLUTE) 0.3 0.0 - 0.4 x10E3/uL   Basophils Absolute 0.0 0.0 - 0.2 x10E3/uL   Immature Granulocytes 0 Not Estab. %   Immature Grans (Abs) 0.0 0.0 - 0.1 x10E3/uL  Comprehensive metabolic panel  Result Value Ref Range   Glucose 66 65 - 99 mg/dL   BUN 8 6 - 20 mg/dL   Creatinine, Ser 9.14 0.57 - 1.00 mg/dL   GFR calc non Af Amer 90 >59 mL/min/1.73   GFR calc Af Amer 104 >59 mL/min/1.73   BUN/Creatinine Ratio 9 9 - 23   Sodium 138 134 - 144 mmol/L   Potassium 4.6 3.5 - 5.2 mmol/L   Chloride 102 96 - 106 mmol/L   CO2 22 20 - 29 mmol/L   Calcium 9.7 8.7 - 10.2 mg/dL   Total Protein 7.3 6.0 - 8.5 g/dL   Albumin 4.5 3.5 - 5.5 g/dL   Globulin, Total 2.8  1.5 - 4.5 g/dL   Albumin/Globulin Ratio 1.6 1.2 - 2.2   Bilirubin Total <0.2 0.0 - 1.2 mg/dL   Alkaline Phosphatase 60 39 - 117 IU/L   AST 15 0 - 40 IU/L   ALT 15 0 - 32 IU/L  Lipid Panel w/o Chol/HDL Ratio  Result Value Ref Range   Cholesterol, Total 172 100 - 199 mg/dL   Triglycerides 90 0 - 149 mg/dL   HDL 47 >78 mg/dL   VLDL Cholesterol Cal 18 5 - 40 mg/dL   LDL Calculated 469 (H) 0 - 99 mg/dL  TSH  Result Value Ref Range   TSH 1.080 0.450 - 4.500 uIU/mL  UA/M w/rflx Culture, Routine    Result Value Ref Range   Specific Gravity, UA 1.020 1.005 - 1.030   pH, UA 6.5 5.0 - 7.5   Color, UA Yellow Yellow   Appearance Ur Cloudy (A) Clear   Leukocytes, UA 1+ (A) Negative   Protein, UA Negative Negative/Trace   Glucose, UA Negative Negative   Ketones, UA Negative Negative   RBC, UA Negative Negative   Bilirubin, UA Negative Negative   Urobilinogen, Ur 0.2 0.2 - 1.0 mg/dL   Nitrite, UA Negative Negative   Microscopic Examination See below:   HgB A1c  Result Value Ref Range   Hgb A1c MFr Bld 6.1 (H) 4.8 - 5.6 %   Est. average glucose Bld gHb Est-mCnc 128 mg/dL  Microalbumin, Urine Waived  Result Value Ref Range   Microalb, Ur Waived 30 (H) 0 - 19 mg/L   Creatinine, Urine Waived 200 10 - 300 mg/dL   Microalb/Creat Ratio <30 <30 mg/g      Assessment & Plan:   Problem List Items Addressed This Visit      Cardiovascular and Mediastinum   Migraine    Start nortriptyline to help reduce frequency of migraines and keep imitrex on hand for prn use. Risks and benefits reviewed      Relevant Medications   nortriptyline (PAMELOR) 10 MG capsule   SUMAtriptan (IMITREX) 50 MG tablet     Respiratory   Asthma    Stable on prn albuterol with no recent flares. Continue current regimen.         Endocrine   Diabetes mellitus without complication (HCC) - Primary    No baseline A1C on file, await results and tx if needed. Work on diet and exercise modifications      Relevant Orders   Comprehensive metabolic panel (Completed)   HgB A1c (Completed)   Microalbumin, Urine Waived (Completed)     Other   Obesity    Discussed lifestyle modifications       Other Visit Diagnoses    Annual physical exam       Relevant Orders   CBC with Differential/Platelet (Completed)   Lipid Panel w/o Chol/HDL Ratio (Completed)   TSH (Completed)   UA/M w/rflx Culture, Routine (Completed)   Syphilis       Discussed urgency for treatment, pt qualifies for free rides through [place]. Will  [facility] for [facility] to follow up with her as well       Follow up plan: Return in about 4 weeks (around 09/04/2018) for migraine f/u.   LABORATORY TESTING:  - Pap smear: done elsewhere  IMMUNIZATIONS:   - Tdap: Tetanus vaccination status reviewed: last tetanus booster within 10 years. - Influenza: Up to date - Pneumovax: Refused - Prevnar: Not applicable - HPV: Refused - Zostavax vaccine: Not  applicable  PATIENT COUNSELING:   Advised to take 1 mg of folate supplement per day if capable of pregnancy.   Sexuality: Discussed sexually transmitted diseases, partner selection, use of condoms, avoidance of unintended pregnancy  and contraceptive alternatives.   Advised to avoid cigarette smoking.  I discussed with the patient that most people either abstain from alcohol or drink within safe limits (<=14/week and <=4 drinks/occasion for males, <=7/weeks and <= 3 drinks/occasion for females) and that the risk for alcohol disorders and other health effects rises proportionally with the number of drinks per week and how often a drinker exceeds daily limits.  Discussed cessation/primary prevention of drug use and availability of treatment for abuse.   Diet: Encouraged to adjust caloric intake to maintain  or achieve ideal body weight, to reduce intake of dietary saturated fat and total fat, to limit sodium intake by avoiding high sodium foods and not adding table salt, and to maintain adequate dietary potassium and calcium preferably from fresh fruits, vegetables, and low-fat dairy products.    stressed the importance of regular exercise  Injury prevention: Discussed safety belts, safety helmets, smoke detector, smoking near bedding or upholstery.   Dental health: Discussed importance of regular tooth brushing, flossing, and dental visits.    NEXT PREVENTATIVE PHYSICAL DUE IN 1 YEAR. Return in about 4 weeks (around 09/04/2018) for migraine f/u.

## 2018-08-08 LAB — COMPREHENSIVE METABOLIC PANEL
A/G RATIO: 1.6 (ref 1.2–2.2)
ALT: 15 IU/L (ref 0–32)
AST: 15 IU/L (ref 0–40)
Albumin: 4.5 g/dL (ref 3.5–5.5)
Alkaline Phosphatase: 60 IU/L (ref 39–117)
BUN/Creatinine Ratio: 9 (ref 9–23)
BUN: 8 mg/dL (ref 6–20)
Bilirubin Total: <0.2 mg/dL (ref 0.0–1.2)
CALCIUM: 9.7 mg/dL (ref 8.7–10.2)
CO2: 22 mmol/L (ref 20–29)
Chloride: 102 mmol/L (ref 96–106)
Creatinine, Ser: 0.86 mg/dL (ref 0.57–1.00)
GFR calc non Af Amer: 90 mL/min/{1.73_m2} (ref >59)
GFR, EST AFRICAN AMERICAN: 104 mL/min/{1.73_m2} (ref >59)
GLUCOSE: 66 mg/dL (ref 65–99)
Globulin, Total: 2.8 g/dL (ref 1.5–4.5)
POTASSIUM: 4.6 mmol/L (ref 3.5–5.2)
Sodium: 138 mmol/L (ref 134–144)
TOTAL PROTEIN: 7.3 g/dL (ref 6.0–8.5)

## 2018-08-08 LAB — CBC WITH DIFFERENTIAL/PLATELET
BASOS ABS: 0 10*3/uL (ref 0.0–0.2)
Basos: 1 %
EOS (ABSOLUTE): 0.3 10*3/uL (ref 0.0–0.4)
Eos: 4 %
Hematocrit: 39.7 % (ref 34.0–46.6)
Hemoglobin: 13 g/dL (ref 11.1–15.9)
IMMATURE GRANULOCYTES: 0 %
Immature Grans (Abs): 0 10*3/uL (ref 0.0–0.1)
LYMPHS: 29 %
Lymphocytes Absolute: 2.4 10*3/uL (ref 0.7–3.1)
MCH: 27.9 pg (ref 26.6–33.0)
MCHC: 32.7 g/dL (ref 31.5–35.7)
MCV: 85 fL (ref 79–97)
Monocytes Absolute: 0.7 10*3/uL (ref 0.1–0.9)
Monocytes: 9 %
NEUTROS PCT: 57 %
Neutrophils Absolute: 5 10*3/uL (ref 1.4–7.0)
Platelets: 245 10*3/uL (ref 150–450)
RBC: 4.66 x10E6/uL (ref 3.77–5.28)
RDW: 13.8 % (ref 12.3–15.4)
WBC: 8.6 10*3/uL (ref 3.4–10.8)

## 2018-08-08 LAB — LIPID PANEL W/O CHOL/HDL RATIO
Cholesterol, Total: 172 mg/dL (ref 100–199)
HDL: 47 mg/dL (ref >39)
LDL Calculated: 107 mg/dL — ABNORMAL HIGH (ref 0–99)
TRIGLYCERIDES: 90 mg/dL (ref 0–149)
VLDL CHOLESTEROL CAL: 18 mg/dL (ref 5–40)

## 2018-08-08 LAB — TSH: TSH: 1.08 u[IU]/mL (ref 0.450–4.500)

## 2018-08-08 LAB — HEMOGLOBIN A1C
Est. average glucose Bld gHb Est-mCnc: 128 mg/dL
HEMOGLOBIN A1C: 6.1 % — AB (ref 4.8–5.6)

## 2018-08-10 DIAGNOSIS — A549 Gonococcal infection, unspecified: Secondary | ICD-10-CM | POA: Diagnosis not present

## 2018-08-10 DIAGNOSIS — E669 Obesity, unspecified: Secondary | ICD-10-CM | POA: Insufficient documentation

## 2018-08-10 NOTE — Assessment & Plan Note (Signed)
Stable on prn albuterol with no recent flares. Continue current regimen.

## 2018-08-10 NOTE — Assessment & Plan Note (Signed)
No baseline A1C on file, await results and tx if needed. Work on diet and exercise modifications

## 2018-08-10 NOTE — Patient Instructions (Signed)
Follow up in 1 month   

## 2018-08-10 NOTE — Assessment & Plan Note (Signed)
Start nortriptyline to help reduce frequency of migraines and keep imitrex on hand for prn use. Risks and benefits reviewed

## 2018-08-10 NOTE — Assessment & Plan Note (Signed)
Discussed lifestyle modifications.

## 2018-09-06 IMAGING — CR DG CHEST 2V
1 series · 2 of 2 positions shown · non-contrast
Comparison: None.

CLINICAL DATA: Cough.

EXAM:
CHEST  2 VIEW

[Series 1: w chest pa · 0.14mm/px · 2 of 2 slices shown]
[im 1/2]
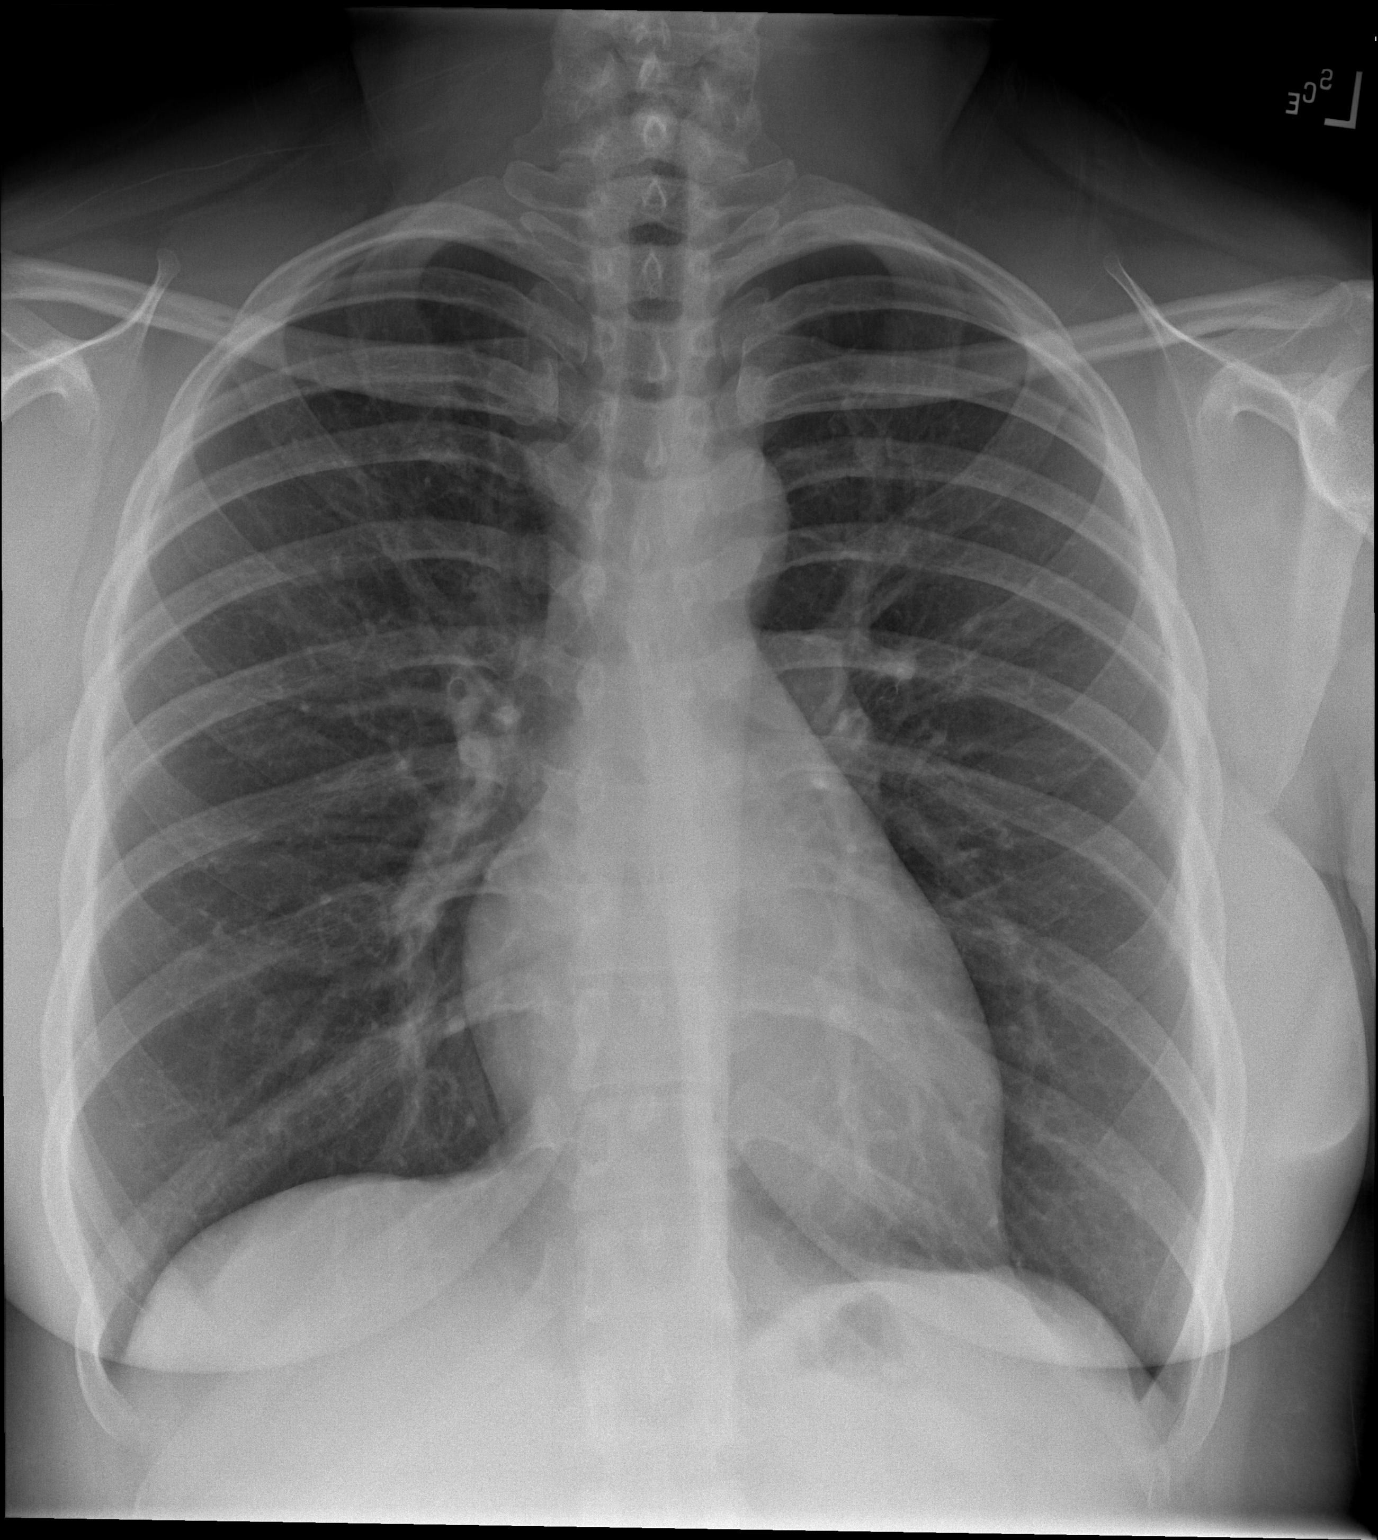
[im 2/2]
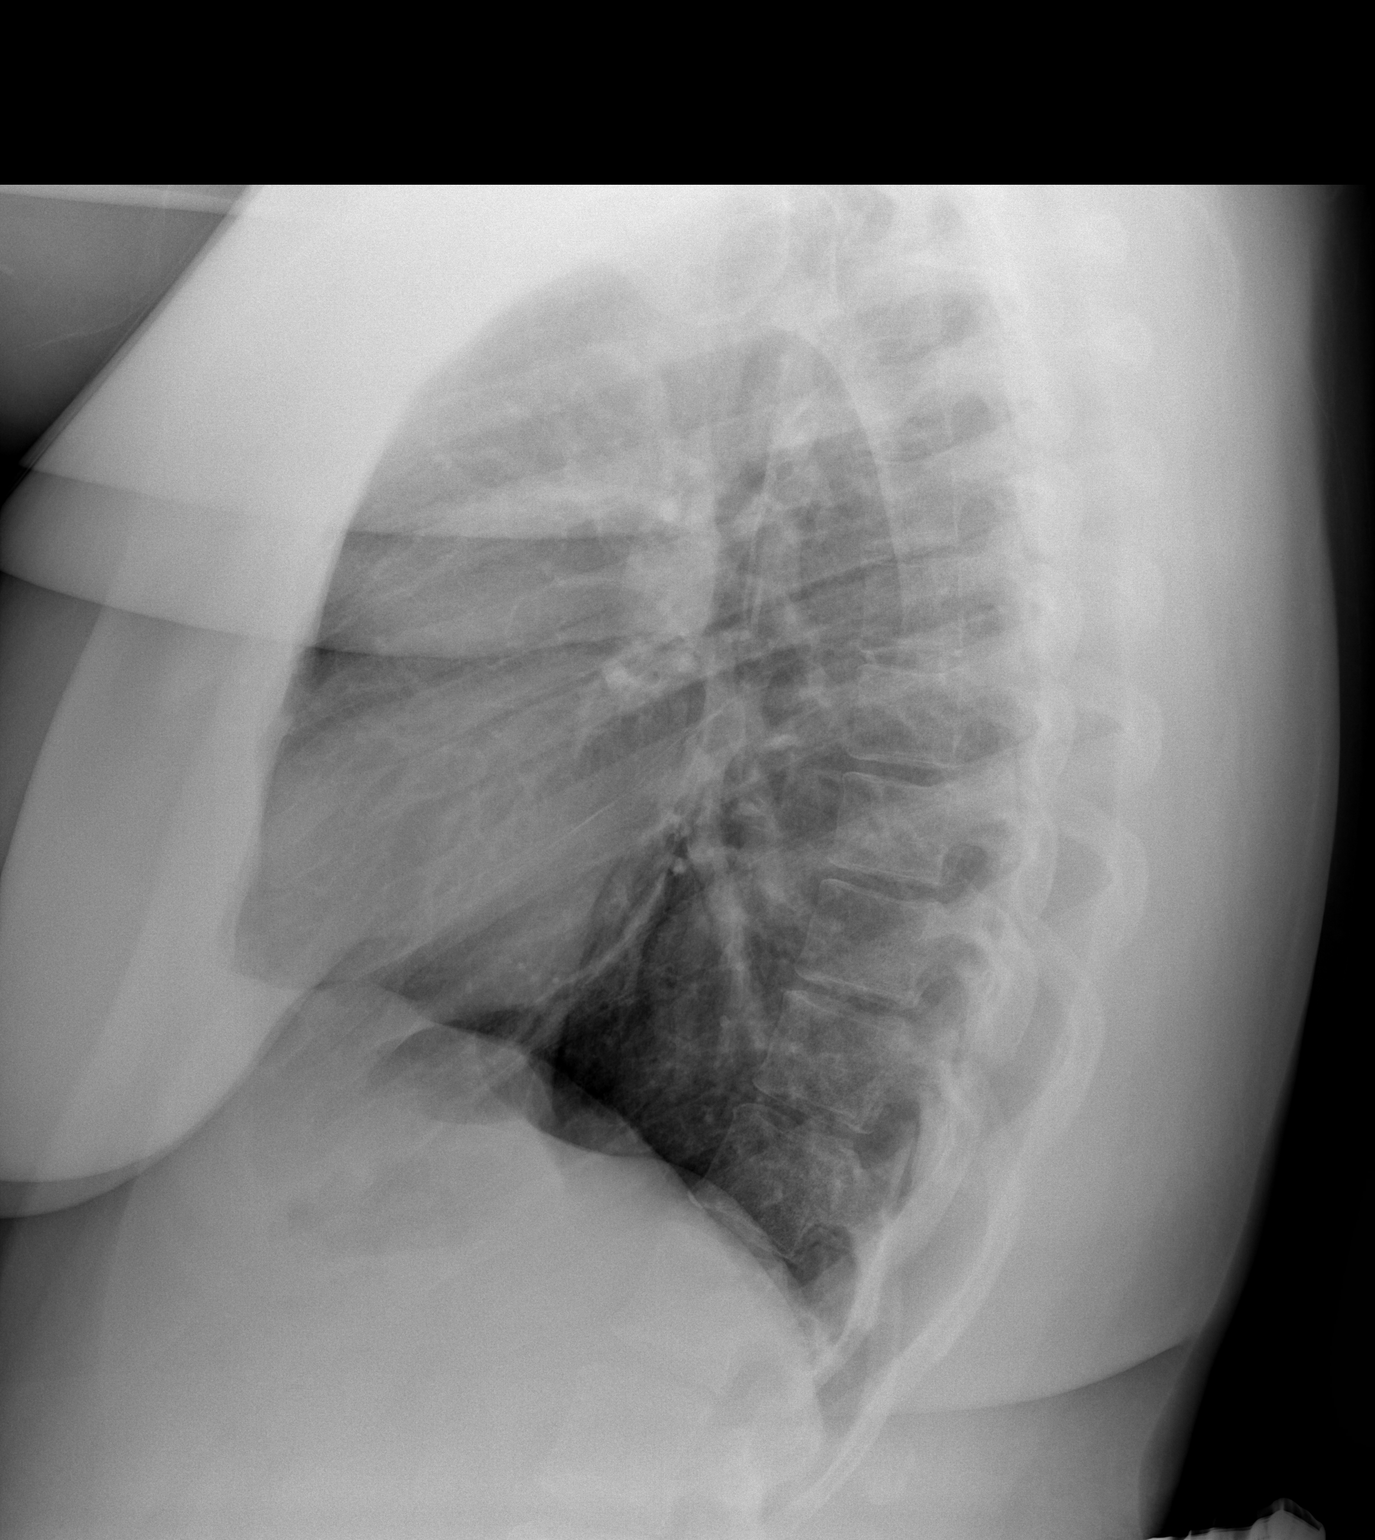

[2 of 2 positions shown; findings below may reference images not displayed]

FINDINGS: The heart size and mediastinal contours are within normal limits.
There is no evidence of pulmonary edema, consolidation,
pneumothorax, nodule or pleural fluid. The visualized skeletal
structures are unremarkable.
IMPRESSION: No active cardiopulmonary disease.

## 2018-09-24 DIAGNOSIS — H52203 Unspecified astigmatism, bilateral: Secondary | ICD-10-CM | POA: Diagnosis not present

## 2018-09-28 DIAGNOSIS — H5213 Myopia, bilateral: Secondary | ICD-10-CM | POA: Diagnosis not present

## 2018-10-06 DIAGNOSIS — H52203 Unspecified astigmatism, bilateral: Secondary | ICD-10-CM | POA: Diagnosis not present

## 2019-02-16 ENCOUNTER — Other Ambulatory Visit: Payer: Self-pay | Admitting: Family Medicine

## 2019-02-16 NOTE — Telephone Encounter (Signed)
Requested Prescriptions  Pending Prescriptions Disp Refills  . pantoprazole (PROTONIX) 40 MG tablet [Pharmacy Med Name: PANTOPRAZOLE SOD DR 40 MG TAB] 90 tablet 1    Sig: TAKE 1 TABLET BY MOUTH EVERY DAY     Gastroenterology: Proton Pump Inhibitors Passed - 02/16/2019  9:43 AM      Passed - Valid encounter within last 12 months    Recent Outpatient Visits          6 months ago Diabetes mellitus without complication [facility])   [facility] [place], [place], PA-C   7 months ago Migraine without aura and without status migrainosus, not intractable   [facility] [place], [place], [place]

## 2019-03-11 ENCOUNTER — Other Ambulatory Visit: Payer: Self-pay | Admitting: Family Medicine

## 2019-03-11 NOTE — Telephone Encounter (Signed)
Can this patient receive a refill on this medication? 

## 2019-03-16 ENCOUNTER — Encounter: Payer: Self-pay | Admitting: Family Medicine

## 2019-03-16 ENCOUNTER — Other Ambulatory Visit: Payer: Self-pay

## 2019-03-16 ENCOUNTER — Ambulatory Visit (INDEPENDENT_AMBULATORY_CARE_PROVIDER_SITE_OTHER): Payer: Medicaid Other | Admitting: Family Medicine

## 2019-03-16 VITALS — Temp 98.6°F

## 2019-03-16 DIAGNOSIS — N898 Other specified noninflammatory disorders of vagina: Secondary | ICD-10-CM | POA: Diagnosis not present

## 2019-03-16 DIAGNOSIS — R7301 Impaired fasting glucose: Secondary | ICD-10-CM

## 2019-03-16 DIAGNOSIS — G43009 Migraine without aura, not intractable, without status migrainosus: Secondary | ICD-10-CM

## 2019-03-16 LAB — BAYER DCA HB A1C WAIVED: HB A1C (BAYER DCA - WAIVED): 6.3 % (ref <7.0)

## 2019-03-16 MED ORDER — FLUCONAZOLE 150 MG PO TABS: 150.0000 mg | ORAL_TABLET | Freq: Once | ORAL | 0 refills | Status: AC

## 2019-03-16 MED ORDER — SUMATRIPTAN SUCCINATE 50 MG PO TABS: ORAL_TABLET | ORAL | 6 refills | Status: DC

## 2019-03-16 MED ORDER — NORTRIPTYLINE HCL 10 MG PO CAPS: 10.0000 mg | ORAL_CAPSULE | Freq: Every day | ORAL | 1 refills | Status: DC

## 2019-03-16 NOTE — Progress Notes (Signed)
Temp 98.6 F (37 C) (Oral)    Subjective:    Patient ID: Angela Douglas, female    DOB: 1986-07-08, 33 y.o.   MRN: [id number]  HPI: Angela Douglas is a 33 y.o. female  Chief Complaint  Patient presents with  . Vaginal Itching    Patient thinks she has a yeast infection.Patient states she gets them because she's hypoglycemic. Has tried OTC medications with no reflief.   . Vaginal Discharge  . Migraine     This visit was completed via WebEx due to the restrictions of the COVID-19 pandemic. All issues as above were discussed and addressed. Physical exam was done as above through visual confirmation on WebEx. If it was felt that the patient should be evaluated in the office, they were directed there. The patient verbally consented to this visit. Location of the patient: home Location of the provider: home Those involved with this call:  Provider: Roosvelt Maserachel , PA-C CMA: Myrtha MantisKeri Bullock, CMA Front Desk/Registration: Harriet PhoJoliza Johnson  Time spent on call: 25 minutes with patient face to face via video conference. More than 50% of this time was spent in counseling and coordination of care. 5 minutes total spent in review of patient's record and preparation of their chart. .  I verified patient identity using two factors (patient name and date of birth). Patient consents verbally to being seen via telemedicine visit today.   Patient here today for migraine f/u and vaginal issues.   Since starting on nortriptyline at bedtime, notes good improvement in migraines. 2 migraines/month rather than daily, imitrex helping when migraines do occur and typically they aren't lasting more than 1 day. Feels much better on the medicine. Denies any new concerns there.   Having several weeks of vaginal itching. Prone to yeast infections, especially when she feels her sugars have been up which she thinks they have been. Tried OTC monistat with no relief. Denies urinary sxs, concern for STIs, fever, chills,  abdominal pain, pelvic pain.   Hx of diabetes, with recent labs the past year or so showing A1Cs in the low 6's range with diet control. Diet has been poor the past few months due to quarantine and lay off and has not been exercising. States she's gained a lot of weight and knows her sugars aren't behaving. Does not check them at home.   Relevant past medical, surgical, family and social history reviewed and updated as indicated. Interim medical history since our last visit reviewed. Allergies and medications reviewed and updated.  Review of Systems  Per HPI unless specifically indicated above     Objective:    Temp 98.6 F (37 C) (Oral)   Wt Readings from Last 3 Encounters:  08/07/18 258 lb (117 kg)  06/26/18 259 lb 4.8 oz (117.6 kg)  05/18/18 220 lb (99.8 kg)    Physical Exam Vitals signs and nursing note reviewed.  Constitutional:      General: She is not in acute distress.    Appearance: Normal appearance.  HENT:     Head: Atraumatic.     Right Ear: External ear normal.     Left Ear: External ear normal.     Nose: Nose normal. No congestion.     Mouth/Throat:     Mouth: Mucous membranes are moist.     Pharynx: Oropharynx is clear. No posterior oropharyngeal erythema.  Eyes:     Extraocular Movements: Extraocular movements intact.     Conjunctiva/sclera: Conjunctivae normal.  Neck:     Musculoskeletal:  Normal range of motion.  Cardiovascular:     Comments: Unable to assess via virtual visit Pulmonary:     Effort: Pulmonary effort is normal. No respiratory distress.  Musculoskeletal: Normal range of motion.  Skin:    General: Skin is dry.     Findings: No erythema.  Neurological:     Mental Status: She is alert and oriented to person, place, and time.  Psychiatric:        Mood and Affect: Mood normal.        Thought Content: Thought content normal.        Judgment: Judgment normal.     Results for orders placed or performed in visit on 03/16/19  Bayer DCA Hb  A1c Waived  Result Value Ref Range   HB A1C (BAYER DCA - WAIVED) 6.3 <7.0 %      Assessment & Plan:   Problem List Items Addressed This Visit      Cardiovascular and Mediastinum   Migraine - Primary    Improved on nortriptyline, continue current regimen with prn imitrex for breakthrough migraines      Relevant Medications   SUMAtriptan (IMITREX) 50 MG tablet   nortriptyline (PAMELOR) 10 MG capsule     Endocrine   IFG (impaired fasting glucose)    Recheck A1C, continue working on diet and exercise for better control      Relevant Orders   Bayer DCA Hb A1c Waived (Completed)    Other Visit Diagnoses    Vaginal itching       Tx with diflucan, f/u for wet prep if not improving. Work hard on blood sugar control. Probiotics recommended       Follow up plan: Return in about 6 months (around 09/16/2019) for CPE.

## 2019-03-19 NOTE — Assessment & Plan Note (Signed)
Recheck A1C, continue working on diet and exercise for better control

## 2019-03-19 NOTE — Assessment & Plan Note (Signed)
Improved on nortriptyline, continue current regimen with prn imitrex for breakthrough migraines

## 2019-04-09 ENCOUNTER — Encounter: Payer: Self-pay | Admitting: Family Medicine

## 2019-04-09 ENCOUNTER — Ambulatory Visit (INDEPENDENT_AMBULATORY_CARE_PROVIDER_SITE_OTHER): Payer: Medicaid Other | Admitting: Family Medicine

## 2019-04-09 ENCOUNTER — Other Ambulatory Visit: Payer: Self-pay

## 2019-04-09 VITALS — Temp 98.6°F

## 2019-04-09 DIAGNOSIS — M79644 Pain in right finger(s): Secondary | ICD-10-CM | POA: Diagnosis not present

## 2019-04-09 MED ORDER — CLINDAMYCIN HCL 150 MG PO CAPS: 150.0000 mg | ORAL_CAPSULE | Freq: Two times a day (BID) | ORAL | 0 refills | Status: DC

## 2019-04-09 NOTE — Progress Notes (Signed)
Temp 98.6 F (37 C)    Subjective:    Patient ID: Angela Douglas, female    DOB: [birth year], 33 y.o.   MRN: [id number]  HPI: Angela Douglas is a 33 y.o. female  Chief Complaint  Patient presents with  . Trauma    right index, cut on Monday     . This visit was completed via WebEx due to the restrictions of the COVID-19 pandemic. All issues as above were discussed and addressed. Physical exam was done as above through visual confirmation on WebEx. If it was felt that the patient should be evaluated in the office, they were directed there. The patient verbally consented to this visit. . Location of the patient: in car . Location of the provider: home . Those involved with this call:  . Provider: Roosvelt Maser, PA-C . CMA: Tiffany Reel, CMA . Front Desk/Registration: Harriet Pho  . Time spent on call: 10 minutes with patient face to face via video conference. More than 50% of this time was spent in counseling and coordination of care. 5 minutes total spent in review of patient's record and preparation of their chart. I verified patient identity using two factors (patient name and date of birth). Patient consents verbally to being seen via telemedicine visit today.   Patient presenting today with concern about a puncture wound from a rusty metal wire that happened about 5 days ago to right index finger. States it didn't go very deep but she was just concerned about tetanus vaccine status and infection. Did not have prolonged bleeding and no swelling, fevers, redness, drainage, worsening pain since occurrence. Soaked the finger in bleach water after incident and has been keeping it clean and dry.   Relevant past medical, surgical, family and social history reviewed and updated as indicated. Interim medical history since our last visit reviewed. Allergies and medications reviewed and updated.  Review of Systems  Per HPI unless specifically indicated above     Objective:     Temp 98.6 F (37 C)   Wt Readings from Last 3 Encounters:  08/07/18 258 lb (117 kg)  06/26/18 259 lb 4.8 oz (117.6 kg)  05/18/18 220 lb (99.8 kg)    Physical Exam Vitals signs and nursing note reviewed.  Constitutional:      General: She is not in acute distress.    Appearance: Normal appearance.  HENT:     Head: Atraumatic.     Right Ear: External ear normal.     Left Ear: External ear normal.     Nose: Nose normal. No congestion.     Mouth/Throat:     Mouth: Mucous membranes are moist.     Pharynx: Oropharynx is clear. No posterior oropharyngeal erythema.  Eyes:     Extraocular Movements: Extraocular movements intact.     Conjunctiva/sclera: Conjunctivae normal.  Neck:     Musculoskeletal: Normal range of motion.  Cardiovascular:     Comments: Unable to assess via virtual visit Pulmonary:     Effort: Pulmonary effort is normal. No respiratory distress.  Musculoskeletal: Normal range of motion.        General: No swelling.  Skin:    General: Skin is dry.     Findings: No erythema.     Comments: Pinpoint well healing puncture hole right 2nd digit, no surrounding edema, erythema, drainage  Neurological:     Mental Status: She is alert and oriented to person, place, and time.  Psychiatric:  Mood and Affect: Mood normal.        Thought Content: Thought content normal.        Judgment: Judgment normal.     Results for orders placed or performed in visit on 03/16/19  Bayer DCA Hb A1c Waived  Result Value Ref Range   HB A1C (BAYER DCA - WAIVED) 6.3 <7.0 %      Assessment & Plan:   Problem List Items Addressed This Visit    None    Visit Diagnoses    Pain of finger of right hand    -  Primary   UTD on tetanus vaccine, and does not appear infected. Wound care reviewed, abx sent in case worsening       Follow up plan: Return for as scheduled.

## 2019-05-14 ENCOUNTER — Ambulatory Visit: Payer: Self-pay | Admitting: *Deleted

## 2019-05-14 NOTE — Telephone Encounter (Signed)
Pt calling with complaints of a deep intense pain in left calf and thigh that radiates upwards and downwards. 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 Pt denies any redness,tenderness,or swelling to the area. Pt states with standing the pain lessens and it feels better than with lying down.Pt has not tried any over the counter medications. Pt given home care advice and advised that if pain continues or other symptoms develop to seek treatment in the ED. Pt verbalized understanding.  Reason for Disposition . Caused by overuse from recent vigorous activity (e.g.,  aerobics, jogging/running, physical work, prolonged walking, sports)  Answer Assessment - Initial Assessment Questions 1. ONSET: "When did the pain start?"      Around 6:30 in the morning 2. LOCATION: "Where is the pain located?"      Left leg in calf and thigh area 3. PAIN: "How bad is the pain?"    (Scale 1-10; or mild, moderate, severe)   -  MILD (1-3): doesn't interfere with normal activities    -  MODERATE (4-7): interferes with normal activities (e.g., work or school) or awakens from sleep, limping    -  SEVERE (8-10): excruciating pain, unable to do any normal activities, unable to walk     Rates pain at 6 4. 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 CAUSE: "What do you think is causing the leg pain?"     Unsure but thought it was a blood clot 6. OTHER SYMPTOMS: "Do you have any other symptoms?" (e.g., chest pain, back pain, breathing difficulty, swelling, rash, fever, numbness, weakness)     Back pain 7. PREGNANCY: "Is there any chance you are pregnant?" "When was your last menstrual period?"     No, on birth control implant-2019 last cycle  Protocols used: LEG PAIN-A-AH

## 2019-06-10 ENCOUNTER — Encounter: Payer: Self-pay | Admitting: Family Medicine

## 2019-06-11 ENCOUNTER — Encounter: Payer: Self-pay | Admitting: Family Medicine

## 2019-08-20 ENCOUNTER — Encounter: Payer: Self-pay | Admitting: Family Medicine

## 2019-08-20 ENCOUNTER — Other Ambulatory Visit: Payer: Self-pay

## 2019-08-20 ENCOUNTER — Ambulatory Visit (INDEPENDENT_AMBULATORY_CARE_PROVIDER_SITE_OTHER): Payer: Medicaid Other | Admitting: Family Medicine

## 2019-08-20 VITALS — BP 128/86 | HR 78 | Temp 99.4°F

## 2019-08-20 DIAGNOSIS — M545 Low back pain, unspecified: Secondary | ICD-10-CM

## 2019-08-20 DIAGNOSIS — A599 Trichomoniasis, unspecified: Secondary | ICD-10-CM | POA: Diagnosis not present

## 2019-08-20 DIAGNOSIS — R8271 Bacteriuria: Secondary | ICD-10-CM | POA: Diagnosis not present

## 2019-08-20 MED ORDER — TRIAMCINOLONE ACETONIDE 40 MG/ML IJ SUSP
40.0000 mg | Freq: Once | INTRAMUSCULAR | Status: AC
  Administered 2019-08-20: 40 mg via INTRAMUSCULAR

## 2019-08-20 MED ORDER — METRONIDAZOLE 500 MG PO TABS: 500.0000 mg | ORAL_TABLET | Freq: Once | ORAL | 0 refills | Status: AC

## 2019-08-20 MED ORDER — CYCLOBENZAPRINE HCL 5 MG PO TABS: 5.0000 mg | ORAL_TABLET | Freq: Three times a day (TID) | ORAL | 0 refills | Status: DC | PRN

## 2019-08-20 NOTE — Progress Notes (Signed)
BP 128/86   Pulse 78   Temp 99.4 F (37.4 C) (Oral)   SpO2 98%    Subjective:    Patient ID: Angela Douglas, female    DOB: January 01, 1986, 33 y.o.   MRN: [id number]  HPI: Angela Douglas is a 33 y.o. female  Chief Complaint  Patient presents with  . Back Pain    pt states she has had lower back pain since early Wednesday morning. States she does not remember injuring her back, and has been taking ibuprofen and tylenol    Patient here today for 3 days of significant midline low back pain. Slept with a different lumbar support pillow than usual prior to the morning of onset and thinks this may have caused the issue. Since that morning has had strong dull aching and pulling pain that is much worse with movement, especially standing up straight. Is lessening some over time with ibuprofen, tylenol, and heat but still very painful. Denies radiation down legs, weakness or numbness of LEs, bowel or bladder incontinence, fevers, urinary sxs.   Relevant past medical, surgical, family and social history reviewed and updated as indicated. Interim medical history since our last visit reviewed. Allergies and medications reviewed and updated.  Review of Systems  Per HPI unless specifically indicated above     Objective:    BP 128/86   Pulse 78   Temp 99.4 F (37.4 C) (Oral)   SpO2 98%   Wt Readings from Last 3 Encounters:  08/07/18 258 lb (117 kg)  06/26/18 259 lb 4.8 oz (117.6 kg)  05/18/18 220 lb (99.8 kg)    Physical Exam Vitals signs and nursing note reviewed.  Constitutional:      Appearance: Normal appearance. She is not ill-appearing.  HENT:     Head: Atraumatic.  Eyes:     Extraocular Movements: Extraocular movements intact.     Conjunctiva/sclera: Conjunctivae normal.  Neck:     Musculoskeletal: Normal range of motion and neck supple.  Cardiovascular:     Rate and Rhythm: Normal rate and regular rhythm.     Heart sounds: Normal heart sounds.  Pulmonary:   Effort: Pulmonary effort is normal.     Breath sounds: Normal breath sounds.  Abdominal:     General: Bowel sounds are normal.     Palpations: Abdomen is soft.     Tenderness: There is no abdominal tenderness. There is no right CVA tenderness, left CVA tenderness or guarding.  Musculoskeletal:     Comments: Slow, antalgic movements - SLR b/l ttp midline over lumbar spine   Skin:    General: Skin is warm and dry.  Neurological:     Mental Status: She is alert and oriented to person, place, and time.  Psychiatric:        Mood and Affect: Mood normal.        Thought Content: Thought content normal.        Judgment: Judgment normal.    Results for orders placed or performed in visit on 03/16/19  Bayer DCA Hb A1c Waived  Result Value Ref Range   HB A1C (BAYER DCA - WAIVED) 6.3 <7.0 %      Assessment & Plan:   Problem List Items Addressed This Visit    None    Visit Diagnoses    Acute midline low back pain, unspecified whether sciatica present    -  Primary   Suspect lumbar strain, stretches, heat, massage, IM kenalog and flexeril prn given today. Work note  x 2 days given. F/u for x-ray if not improving   Relevant Medications   triamcinolone acetonide (KENALOG-40) injection 40 mg (Completed)   cyclobenzaprine (FLEXERIL) 5 MG tablet   Other Relevant Orders   UA/M w/rflx Culture, Routine   DG Lumbar Spine Complete   Trichimoniasis       Incidental finding on U/A. Tx with flagyl, have any recent partners tested/treated   Relevant Medications   metroNIDAZOLE (FLAGYL) 500 MG tablet   Bacteria in urine       Will await urine culture, treat if needed       Follow up plan: No follow-ups on file.

## 2019-08-22 LAB — URINE CULTURE, REFLEX

## 2019-08-22 LAB — MICROSCOPIC EXAMINATION: RBC: NONE SEEN /hpf (ref 0–2)

## 2019-08-22 LAB — UA/M W/RFLX CULTURE, ROUTINE
Bilirubin, UA: NEGATIVE
Glucose, UA: NEGATIVE
Ketones, UA: NEGATIVE
Nitrite, UA: NEGATIVE
Protein,UA: NEGATIVE
RBC, UA: NEGATIVE
Specific Gravity, UA: 1.015 (ref 1.005–1.030)
Urobilinogen, Ur: 0.2 mg/dL (ref 0.2–1.0)
pH, UA: 6 (ref 5.0–7.5)

## 2019-08-26 ENCOUNTER — Other Ambulatory Visit: Payer: Self-pay

## 2019-08-26 ENCOUNTER — Emergency Department
Admission: EM | Admit: 2019-08-26 | Discharge: 2019-08-26 | Disposition: A | Discharge status: Home / Self Care | Payer: Medicaid Other | Attending: Emergency Medicine | Admitting: Emergency Medicine

## 2019-08-26 ENCOUNTER — Telehealth: Payer: Self-pay | Admitting: Family Medicine

## 2019-08-26 ENCOUNTER — Emergency Department: Payer: Medicaid Other

## 2019-08-26 ENCOUNTER — Encounter: Payer: Self-pay | Admitting: Emergency Medicine

## 2019-08-26 DIAGNOSIS — Y939 Activity, unspecified: Secondary | ICD-10-CM | POA: Insufficient documentation

## 2019-08-26 DIAGNOSIS — S39012A Strain of muscle, fascia and tendon of lower back, initial encounter: Secondary | ICD-10-CM | POA: Diagnosis not present

## 2019-08-26 DIAGNOSIS — S161XXA Strain of muscle, fascia and tendon at neck level, initial encounter: Secondary | ICD-10-CM | POA: Insufficient documentation

## 2019-08-26 DIAGNOSIS — E119 Type 2 diabetes mellitus without complications: Secondary | ICD-10-CM | POA: Diagnosis not present

## 2019-08-26 DIAGNOSIS — Z79899 Other long term (current) drug therapy: Secondary | ICD-10-CM | POA: Diagnosis not present

## 2019-08-26 DIAGNOSIS — Y999 Unspecified external cause status: Secondary | ICD-10-CM | POA: Diagnosis not present

## 2019-08-26 DIAGNOSIS — R52 Pain, unspecified: Secondary | ICD-10-CM | POA: Diagnosis not present

## 2019-08-26 DIAGNOSIS — Y33XXXA Other specified events, undetermined intent, initial encounter: Secondary | ICD-10-CM | POA: Diagnosis not present

## 2019-08-26 DIAGNOSIS — M545 Low back pain, unspecified: Secondary | ICD-10-CM

## 2019-08-26 DIAGNOSIS — Y929 Unspecified place or not applicable: Secondary | ICD-10-CM | POA: Diagnosis not present

## 2019-08-26 DIAGNOSIS — Z91040 Latex allergy status: Secondary | ICD-10-CM | POA: Diagnosis not present

## 2019-08-26 DIAGNOSIS — F17210 Nicotine dependence, cigarettes, uncomplicated: Secondary | ICD-10-CM | POA: Insufficient documentation

## 2019-08-26 DIAGNOSIS — J45909 Unspecified asthma, uncomplicated: Secondary | ICD-10-CM | POA: Insufficient documentation

## 2019-08-26 DIAGNOSIS — S199XXA Unspecified injury of neck, initial encounter: Secondary | ICD-10-CM | POA: Diagnosis present

## 2019-08-26 DIAGNOSIS — M5489 Other dorsalgia: Secondary | ICD-10-CM | POA: Diagnosis not present

## 2019-08-26 LAB — POCT PREGNANCY, URINE: Preg Test, Ur: NEGATIVE

## 2019-08-26 MED ORDER — TRAMADOL HCL 50 MG PO TABS: 50.0000 mg | ORAL_TABLET | Freq: Four times a day (QID) | ORAL | 0 refills | Status: DC | PRN

## 2019-08-26 MED ORDER — ORPHENADRINE CITRATE 30 MG/ML IJ SOLN
60.0000 mg | Freq: Two times a day (BID) | INTRAMUSCULAR | Status: DC
  Administered 2019-08-26: 60 mg via INTRAMUSCULAR
  Filled 2019-08-26: qty 2

## 2019-08-26 MED ORDER — TIZANIDINE HCL 4 MG PO TABS: 4.0000 mg | ORAL_TABLET | Freq: Three times a day (TID) | ORAL | 0 refills | Status: DC

## 2019-08-26 MED ORDER — MELOXICAM 15 MG PO TABS: 15.0000 mg | ORAL_TABLET | Freq: Every day | ORAL | 0 refills | Status: DC

## 2019-08-26 MED ORDER — KETOROLAC TROMETHAMINE 30 MG/ML IJ SOLN
30.0000 mg | Freq: Once | INTRAMUSCULAR | Status: AC
  Administered 2019-08-26: 15:00:00 30 mg via INTRAMUSCULAR
  Filled 2019-08-26: qty 1

## 2019-08-26 NOTE — Discharge Instructions (Signed)
Please follow up with your primary care provider for symptoms that are not improving over the next few days.  Return to the ER for symptoms that change or worsen if unable to schedule an appointment. 

## 2019-08-26 NOTE — Telephone Encounter (Signed)
Pt called and stated that she would like a call back from the nurse pt states that back pain is much worse. Pt states that she felt a pop in back. Pt would like a call back as soon as possible.

## 2019-08-26 NOTE — ED Triage Notes (Signed)
Pt comes into the ED via EMS from home with lower back pain, denies injury. 135/92, 99temp, 90HR, 97RA, CBG127

## 2019-08-26 NOTE — ED Notes (Signed)
See triage note  States she slipped on a stuffed toy couple of days go  Was seen by her PCP  Placed on muscle relaxers  States she felt a "pop" in back

## 2019-08-26 NOTE — ED Notes (Signed)
DC instructions given to pt. Pt verbalized understanding. Esign not working.

## 2019-08-26 NOTE — ED Provider Notes (Signed)
[facility] Emergency Department Provider Note ____________________________________________  Time seen: Approximately 2:45 PM  I have reviewed the triage vital signs and the nursing notes.  HISTORY  Chief Complaint Back Pain   HPI Angela Douglas is a 33 y.o. female who presents to the emergency department for treatment and evaluation of lower back pain. She states that she strained it a few days ago and was evaluated by here PCP that gave her flexeril and ibuprofen. It was improving until today when she lifted her leg and twisted around. She heard a "pop" and since then has not been able to get comfortable. She denies radiculopathy, loss of bowel or bladder control, or saddle anesthesia.  Past Medical History:  Diagnosis Date  . Anemia   . Asthma   . Diabetes mellitus without complication (HCC)   . Hypoglycemia     Patient Active Problem List   Diagnosis Date Noted  . Obesity 08/10/2018  . IFG (impaired fasting glucose) 06/29/2018  . Asthma 06/29/2018  . Migraine 06/29/2018    Past Surgical History:  Procedure Laterality Date  . LAPAROSCOPIC OVARIAN    . WISDOM TOOTH EXTRACTION      Prior to Admission medications   Medication Sig Start Date End Date Taking? Authorizing Provider  acetaminophen (TYLENOL) 650 MG CR tablet Take 650 mg by mouth 2 (two) times daily as needed for pain.    [provider]  albuterol (PROVENTIL HFA;VENTOLIN HFA) 108 (90 Base) MCG/ACT inhaler Inhale 2 puffs into the lungs every 6 (six) hours as needed for wheezing or shortness of breath. 06/26/18   Particia NearingLane, Rachel Elizabeth, PA-C  cyclobenzaprine (FLEXERIL) 5 MG tablet Take 1 tablet (5 mg total) by mouth 3 (three) times daily as needed for muscle spasms. 08/20/19   Particia NearingLane, Rachel Elizabeth, PA-C  ibuprofen (ADVIL,MOTRIN) 200 MG tablet Take 800 mg by mouth 2 (two) times daily as needed for moderate pain.    [provider]  meloxicam (MOBIC) 15 MG tablet Take 1  tablet (15 mg total) by mouth daily. 08/26/19   Lanitra Battaglini, Rulon Eisenmengerari B, FNP  nortriptyline (PAMELOR) 10 MG capsule Take 1 capsule (10 mg total) by mouth at bedtime. 03/16/19   Particia NearingLane, Rachel Elizabeth, PA-C  Olopatadine HCl (PAZEO) 0.7 % SOLN Place 1 drop into both eyes daily.    [provider]  ondansetron (ZOFRAN ODT) 4 MG disintegrating tablet Take 1 tablet (4 mg total) by mouth every 8 (eight) hours as needed. 06/26/18   Particia NearingLane, Rachel Elizabeth, PA-C  pantoprazole (PROTONIX) 40 MG tablet TAKE 1 TABLET BY MOUTH EVERY DAY 02/16/19   Particia NearingLane, Rachel Elizabeth, PA-C  SUMAtriptan (IMITREX) 50 MG tablet Take 1 tab at first sign of headache. May repeat in 2 hours if headache persists or recurs. 03/16/19   Particia NearingLane, Rachel Elizabeth, PA-C  tiZANidine (ZANAFLEX) 4 MG tablet Take 1 tablet (4 mg total) by mouth 3 (three) times daily. 08/26/19 09/25/19  Lillyahna Hemberger, Kasandra Knudsenari B, FNP  traMADol (ULTRAM) 50 MG tablet Take 1 tablet (50 mg total) by mouth every 6 (six) hours as needed. 08/26/19   Gracie Gupta, Kasandra Knudsenari B, FNP    Allergies Banana and Latex  Family History  Problem Relation Age of Onset  . Hypertension Mother   . Anemia Mother   . Migraines Mother   . Blindness Mother   . Obesity Mother   . Hypertension Father   . Heart disease Father   . Obesity Brother   . Cancer Maternal Aunt   . Cancer Maternal  Grandmother     Social History Social History   Tobacco Use  . Smoking status: Current Every Day Smoker    Packs/day: 0.25    Types: Cigarettes  . Smokeless tobacco: Never Used  Substance Use Topics  . Alcohol use: Yes  . Drug use: No    Review of Systems Constitutional: Well appearing. Respiratory: Negative for dyspnea. Cardiovascular: Negative for change in skin temperature or color. Musculoskeletal:   Negative for chronic steroid use   Negative for trauma in the presence of osteoporosis  Negative for age over 44 and trauma.  Negative for constitutional symptoms, or history of cancer  Negative for  pain worse at night. Skin: Negative for rash, lesion, or wound.  Genitourinary: Negative for urinary retention. Rectal: Negative for fecal incontinence or new onset constipation/bowel habit changes. Hematological/Immunilogical: Negative for immunosuppression, IV drug use, or fever Neurological: Negative for burning, tingling, numb, electric, radiating pain in the lower extremities..                        Negative for saddle anesthesia.                        Negative for focal neurologic deficit, progressive or disabling symptoms             Negative for saddle anesthesia. ____________________________________________   PHYSICAL EXAM:  VITAL SIGNS: ED Triage Vitals [08/26/19 1413]  Enc Vitals Group     BP (!) 150/83     Pulse Rate 83     Resp 17     Temp 98.1 F (36.7 C)     Temp src      SpO2 97 %     Weight 250 lb (113.4 kg)     Height 5\' 7"  (1.702 m)     Head Circumference      Peak Flow      Pain Score 10     Pain Loc      Pain Edu?      Excl. in GC?     Constitutional: Alert and oriented. Well appearing and in no acute distress. Eyes: Conjunctivae are clear without discharge or drainage.  Head: Atraumatic. Neck: Full, active range of motion. Respiratory: Respirations even and unlabored. Musculoskeletal: Decreased ROM of the back and extremities, Strength 5/5 of the lower extremities as tested. Neurologic: Reflexes of the lower extremities are 2+. Negative straight leg raise on the right and left side. Skin: Atraumatic.  Psychiatric: Behavior and affect are normal.  ____________________________________________   LABS (all labs ordered are listed, but only abnormal results are displayed)  Labs Reviewed  POCT PREGNANCY, URINE   ____________________________________________  RADIOLOGY  Image of the lumbar spine is negative for acute findings per radiology. ____________________________________________   PROCEDURES  Procedure(s) performed:   Procedures ____________________________________________   INITIAL IMPRESSION / ASSESSMENT AND PLAN / ED COURSE  Angela Douglas is a 32 y.o. female 33 year old female presenting to the emergency department for nontraumatic back pain that started a few days ago and worsened today.  No relief with her ibuprofen and Flexeril.  Norflex and Toradol have been ordered in addition to image of the lumbar spine.  Image of the lumbar spine is reassuring.  Patient will be discharged home with a prescription for tramadol, meloxicam, and Zanaflex.  She was advised to follow-up with her primary care provider if her symptoms are not improving over the next few days.  She was advised to  return to the emergency department for symptoms of change or worsen if unable to get an appointment.  Medications  orphenadrine (NORFLEX) injection 60 mg (60 mg Intramuscular Given 08/26/19 1454)  ketorolac (TORADOL) 30 MG/ML injection 30 mg (30 mg Intramuscular Given 08/26/19 1453)    ED Discharge Orders         Ordered    traMADol (ULTRAM) 50 MG tablet  Every 6 hours PRN     08/26/19 1555    meloxicam (MOBIC) 15 MG tablet  Daily     08/26/19 1555    tiZANidine (ZANAFLEX) 4 MG tablet  3 times daily     08/26/19 1555           Pertinent labs & imaging results that were available during my care of the patient were reviewed by me and considered in my medical decision making (see chart for details).  _________________________________________   FINAL CLINICAL IMPRESSION(S) / ED DIAGNOSES  Final diagnoses:  Acute lumbar back pain  Strain of lumbar region, initial encounter     If controlled substance prescribed during this visit, 12 month history viewed on the [place] prior to issuing an initial prescription for Schedule II or III opiod.   Victorino Dike, FNP 08/26/19 1612    Earleen Newport, MD 08/27/19 [phone]

## 2019-08-26 NOTE — Telephone Encounter (Signed)
Patient went to the ER, imaging was negative and was told to follow up with [place] in 3 days.

## 2019-08-26 NOTE — ED Triage Notes (Signed)
PT arrives via ems from home with complaints of lower back post position change at 1240. Pt denies prior injury. No loss of continence.

## 2019-08-30 ENCOUNTER — Ambulatory Visit (INDEPENDENT_AMBULATORY_CARE_PROVIDER_SITE_OTHER): Payer: Medicaid Other | Admitting: Family Medicine

## 2019-08-30 ENCOUNTER — Encounter: Payer: Self-pay | Admitting: Family Medicine

## 2019-08-30 ENCOUNTER — Other Ambulatory Visit: Payer: Self-pay

## 2019-08-30 VITALS — BP 129/86 | HR 60 | Temp 99.2°F

## 2019-08-30 DIAGNOSIS — M545 Low back pain, unspecified: Secondary | ICD-10-CM

## 2019-08-30 DIAGNOSIS — S39012A Strain of muscle, fascia and tendon of lower back, initial encounter: Secondary | ICD-10-CM | POA: Diagnosis not present

## 2019-08-30 NOTE — Progress Notes (Signed)
   BP 129/86   Pulse 60   Temp 99.2 F (37.3 C)   SpO2 98%    Subjective:    Patient ID: [name], female    DOB: [birth year], 33 y.o.   MRN: [id number]  HPI: Peggye Poon is a 33 y.o. female  Chief Complaint  Patient presents with  . Back Pain   Still significant left lower back pain despite prednisone, muscle relaxers, OTC pain relievers, rest, stretches. Was given some tramadol in ER which seems to take the edge off but she's about out of that. Every position is painful, sharp pains traveling up and out from the initial area in left low back per patient. Heating pads and being still help some, especially after taking her medications. Denies numbness or tingling down legs, weakness, bowel or bladder incontinence, fevers. X-ray results from ER visit reviewed and normal.   Relevant past medical, surgical, family and social history reviewed and updated as indicated. Interim medical history since our last visit reviewed. Allergies and medications reviewed and updated.  Review of Systems  Per HPI unless specifically indicated above     Objective:    BP 129/86   Pulse 60   Temp 99.2 F (37.3 C)   SpO2 98%   Wt Readings from Last 3 Encounters:  08/26/19 250 lb (113.4 kg)  08/07/18 258 lb (117 kg)  06/26/18 259 lb 4.8 oz (117.6 kg)    Physical Exam Vitals signs and nursing note reviewed.  Constitutional:      Appearance: Normal appearance. She is not ill-appearing.  HENT:     Head: Atraumatic.  Eyes:     Extraocular Movements: Extraocular movements intact.     Conjunctiva/sclera: Conjunctivae normal.  Neck:     Musculoskeletal: Normal range of motion and neck supple.  Cardiovascular:     Rate and Rhythm: Normal rate and regular rhythm.     Heart sounds: Normal heart sounds.  Pulmonary:     Effort: Pulmonary effort is normal.     Breath sounds: Normal breath sounds.  Musculoskeletal:     Comments: Antalgic gait TTP left lumbar paraspinal muscles - SLR  b/l  Skin:    General: Skin is warm and dry.  Neurological:     Mental Status: She is alert and oriented to person, place, and time.  Psychiatric:        Mood and Affect: Mood normal.        Thought Content: Thought content normal.        Judgment: Judgment normal.     Results for orders placed or performed during the hospital encounter of 08/26/19  Pregnancy, urine POC  Result Value Ref Range   Preg Test, Ur NEGATIVE NEGATIVE      Assessment & Plan:   Problem List Items Addressed This Visit    None    Visit Diagnoses    Acute left-sided low back pain without sciatica    -  Primary   Minimal relief so far despite numerous conservative therapies tried. Recommended PT referral or [facility]opedic Ur[facility], she will go to [facility] today       Follow up plan: Return if symptoms worsen or fail to improve.

## 2019-08-30 NOTE — Patient Instructions (Signed)
[facility]  [facility] Monday - Friday 1:00 PM - 7:30 PM [place], [place] [address]

## 2019-09-13 ENCOUNTER — Ambulatory Visit (INDEPENDENT_AMBULATORY_CARE_PROVIDER_SITE_OTHER): Payer: Medicaid Other | Admitting: Nurse Practitioner

## 2019-09-13 ENCOUNTER — Encounter: Payer: Self-pay | Admitting: Nurse Practitioner

## 2019-09-13 ENCOUNTER — Other Ambulatory Visit: Payer: Self-pay

## 2019-09-13 DIAGNOSIS — K0889 Other specified disorders of teeth and supporting structures: Secondary | ICD-10-CM | POA: Insufficient documentation

## 2019-09-13 MED ORDER — HYDROCODONE-ACETAMINOPHEN 10-325 MG PO TABS: 1.0000 | ORAL_TABLET | Freq: Three times a day (TID) | ORAL | 0 refills | Status: AC | PRN

## 2019-09-13 NOTE — Patient Instructions (Signed)
[place] [phone]   Dental Pain Dental pain may be caused by many things, including:  Tooth decay (cavities or caries).  Infection.  The inner part of the tooth being filled with pus (abscess).  Injury. Sometimes the cause of pain is unknown. Your pain can vary. It may be mild or severe. You may have it all the time, or it may occur only when you are:  Chewing.  Exposed to hot or cold temperature.  Eating or drinking sugary foods or beverages, such as soda or candy. Follow these instructions at home: Medicines  Take over-the-counter and prescription medicines only as told by your doctor.  If you were prescribed an antibiotic medicine, take it as told by your doctor. Do not stop taking the medicine even if you start to feel better. Eating and drinking  Do not eat foods or drinks that cause you pain. These include: ? Very hot or very cold foods or drinks. ? Sweet or sugary foods or drinks. Managing pain and swelling   Gargle with a salt-water mixture 3-4 times a day. To make this, dissolve -1 tsp of salt in 1 cup of warm water.  If told, put ice on the painful area of your face: ? Put ice in a plastic bag. ? Place a towel between your skin and the bag. ? Leave the ice on for 20 minutes, 2-3 times a day. Brushing your teeth  Brush your teeth twice a day using a fluoride toothpaste.  Floss your teeth once a day.  Use a toothpaste made for sensitive teeth as told by your doctor.  Use a soft toothbrush. General instructions  Do not apply heat to the outside of your face.  Watch your dental pain. Let your doctor know if there are any changes.  Keep all follow-up visits as told by your doctor. This is important. Contact a doctor if:  Your pain is not relieved by medicines.  You have new symptoms.  Your symptoms get worse. Get help right away if:  You cannot open your mouth.  You are having trouble breathing or swallowing.  You have a fever.   Your face, neck, or jaw is swollen. Summary  Dental pain may be caused by many things, including tooth decay, injury, or infection. In some cases, the cause is not known.  Your pain may be mild or severe. You may have pain all the time, or you may have it only when you eat or drink.  Take over-the-counter and prescription medicines only as told by your doctor.  Watch your dental pain for any changes. Let your doctor know if symptoms get worse. This information is not intended to replace advice given to you by your health care provider. Make sure you discuss any questions you have with your health care provider. Document Released: 04/15/2008 Document Revised: 02/23/2019 Document Reviewed: 11/10/2017 Elsevier Patient Education  2020 [employer].

## 2019-09-13 NOTE — Progress Notes (Signed)
BP (!) 135/93   Pulse 78   Temp 99 F (37.2 C) (Oral)   Ht 5\' 7"  (1.702 m)   Wt 279 lb (126.6 kg)   SpO2 97%   BMI 43.70 kg/m    Subjective:    Patient ID: Angela Douglas, female    DOB: 1986/01/30, 33 y.o.   MRN: [id number]  HPI: Angela Douglas is a 33 y.o. female  Chief Complaint  Patient presents with  . Pain    x over a week. taking  RX amoxicilin patient states had had a face pain due to infection on her right upper tooth. pt states she has an appt with dentist on the 09/28/19.    DENTAL PAIN Duration: Saw dentist on 09/06/2019 and was treated with Amoxicillin.  Is still taking this, supposed to be for one week every 8 hours.  Has an appointment on 09/28/2019 for tooth to come out.  [facility] is current dentist.  Has been taking Tramadol, which she had for back, but this has not been working for her tooth pain and she has none left.  Reports the pain has been so bad she can not eat and her sugars have been low at times "I feel".  Also reports some increased anxiety with the dental pain, has fear of dentist. Involved teeth: right and upper Dentist evaluation: yes Mechanism of injury:  grinds teeth and has hole in tooth Onset: gradual Severity: 10/10 Quality: aching and dull Frequency: intermittent Radiation: none Aggravating factors: hard foods and chewing, cold Alleviating factors: Tylenol Status: fluctuating Treatments attempted: Tramadol, APAP and NSAIDs Relief with NSAIDs?: No NSAIDs Taken Fevers: yes Swelling: yes Redness: no Paresthesias / decreased sensation: no Sinus pressure: no  Relevant past medical, surgical, family and social history reviewed and updated as indicated. Interim medical history since our last visit reviewed. Allergies and medications reviewed and updated.  Review of Systems  Constitutional: Negative for activity change, appetite change, diaphoresis, fatigue and fever.  HENT: Positive for dental problem.    Respiratory: Negative for cough, chest tightness and shortness of breath.   Cardiovascular: Negative for chest pain, palpitations and leg swelling.  Gastrointestinal: Negative for abdominal distention, abdominal pain, constipation, diarrhea, nausea and vomiting.  Psychiatric/Behavioral: Negative.     Per HPI unless specifically indicated above     Objective:    BP (!) 135/93   Pulse 78   Temp 99 F (37.2 C) (Oral)   Ht 5\' 7"  (1.702 m)   Wt 279 lb (126.6 kg)   SpO2 97%   BMI 43.70 kg/m   Wt Readings from Last 3 Encounters:  09/13/19 279 lb (126.6 kg)  08/26/19 250 lb (113.4 kg)  08/07/18 258 lb (117 kg)    Physical Exam Vitals signs and nursing note reviewed.  Constitutional:      General: She is awake. She is not in acute distress.    Appearance: She is well-developed. She is obese. She is not ill-appearing.  HENT:     Head: Normocephalic.     Right Ear: Hearing normal.     Left Ear: Hearing normal.     Nose: Nose normal.     Mouth/Throat:     Mouth: Mucous membranes are moist.     Dentition: Dental tenderness present.   Eyes:     General: Lids are normal.        Right eye: No discharge.        Left eye: No discharge.  Conjunctiva/sclera: Conjunctivae normal.     Pupils: Pupils are equal, round, and reactive to light.  Neck:     Musculoskeletal: Normal range of motion and neck supple.     Vascular: No carotid bruit.  Cardiovascular:     Rate and Rhythm: Normal rate and regular rhythm.     Heart sounds: Normal heart sounds. No murmur. No gallop.   Pulmonary:     Effort: Pulmonary effort is normal. No accessory muscle usage or respiratory distress.     Breath sounds: Normal breath sounds.  Abdominal:     General: Bowel sounds are normal.     Palpations: Abdomen is soft.  Musculoskeletal:     Right lower leg: No edema.     Left lower leg: No edema.  Skin:    General: Skin is warm and dry.  Neurological:     Mental Status: She is alert and oriented to  person, place, and time.  Psychiatric:        Attention and Perception: Attention normal.        Mood and Affect: Mood normal.        Behavior: Behavior normal. Behavior is cooperative.        Thought Content: Thought content normal.        Judgment: Judgment normal.     Results for orders placed or performed during the hospital encounter of 08/26/19  Pregnancy, urine POC  Result Value Ref Range   Preg Test, Ur NEGATIVE NEGATIVE      Assessment & Plan:   Problem List Items Addressed This Visit      Other   Pain, dental    Acute with worsening symptoms.  Currently on Amoxicillin and scheduled to have tooth out on 09/28/2019, have recommended she call in morning to attempt to move this appointment up or call the [facility], number provided.  Will provide 5 day supply ONLY of Norco for dental pain, recommend she continue Tylenol as needed (max daily dose 3000 MG) + Oragel for discomfort.  Continue abx until complete.  For worsening symptoms advised her to return to office immediately or go to ER as may require IV abx if worsening.         Controlled substance.  Checked data base and no other refills of controlled substances noted.  Last Tramadol fill for #15 tablets on 08/31/2019.  Follow up plan: Return if symptoms worsen or fail to improve.

## 2019-09-13 NOTE — Assessment & Plan Note (Addendum)
Acute with worsening symptoms.  Currently on Amoxicillin and scheduled to have tooth out on 09/28/2019, have recommended she call in morning to attempt to move this appointment up or call the [facility], number provided.  Will provide 5 day supply ONLY of Norco for dental pain, recommend she continue Tylenol as needed (max daily dose 3000 MG) + Oragel for discomfort.  Continue abx until complete.  For worsening symptoms advised her to return to office immediately or go to ER as may require IV abx if worsening.

## 2019-09-17 ENCOUNTER — Encounter: Payer: Medicaid Other | Admitting: Family Medicine

## 2019-09-24 ENCOUNTER — Other Ambulatory Visit: Payer: Self-pay

## 2019-09-24 DIAGNOSIS — Z20828 Contact with and (suspected) exposure to other viral communicable diseases: Secondary | ICD-10-CM | POA: Diagnosis not present

## 2019-09-24 DIAGNOSIS — Z20822 Contact with and (suspected) exposure to covid-19: Secondary | ICD-10-CM

## 2019-09-26 LAB — NOVEL CORONAVIRUS, NAA: SARS-CoV-2, NAA: NOT DETECTED

## 2019-10-18 ENCOUNTER — Encounter: Payer: Self-pay | Admitting: Family Medicine

## 2019-10-26 ENCOUNTER — Encounter: Payer: Self-pay | Admitting: Family Medicine

## 2019-10-26 ENCOUNTER — Other Ambulatory Visit: Payer: Self-pay

## 2019-10-26 ENCOUNTER — Ambulatory Visit (INDEPENDENT_AMBULATORY_CARE_PROVIDER_SITE_OTHER): Payer: Medicaid Other | Admitting: Family Medicine

## 2019-10-26 VITALS — Temp 98.6°F | Ht 67.0 in | Wt 272.0 lb

## 2019-10-26 DIAGNOSIS — G43009 Migraine without aura, not intractable, without status migrainosus: Secondary | ICD-10-CM | POA: Diagnosis not present

## 2019-10-26 DIAGNOSIS — Z6841 Body Mass Index (BMI) 40.0 and over, adult: Secondary | ICD-10-CM | POA: Diagnosis not present

## 2019-10-26 MED ORDER — TOPIRAMATE 25 MG PO TABS: 25.0000 mg | ORAL_TABLET | Freq: Two times a day (BID) | ORAL | 0 refills | Status: DC

## 2019-10-26 NOTE — Progress Notes (Signed)
Temp 98.6 F (37 C) (Oral)   Ht 5\' 7"  (1.702 m)   Wt 272 lb (123.4 kg)   BMI 42.60 kg/m    Subjective:    Patient ID: Angela Douglas, female    DOB: Jul 14, 1986, 33 y.o.   MRN: [id number]  HPI: Angela Douglas is a 33 y.o. female  Chief Complaint  Patient presents with  . Weight Check  . Back Pain    . This visit was completed via WebEx due to the restrictions of the COVID-19 pandemic. All issues as above were discussed and addressed. Physical exam was done as above through visual confirmation on WebEx. If it was felt that the patient should be evaluated in the office, they were directed there. The patient verbally consented to this visit. . Location of the patient: home . Location of the provider: home . Those involved with this call:  . Provider: Merrie Roof, PA-C . CMA: Lesle Chris, [place] . Front Desk/Registration: Jill Side  . Time spent on call: 15 minutes with patient face to face via video conference. More than 50% of this time was spent in counseling and coordination of care. 5 minutes total spent in review of patient's record and preparation of their chart. I verified patient identity using two factors (patient name and date of birth). Patient consents verbally to being seen via telemedicine visit today.   Patient presenting today concerned about her weight and the role her weight may be playing in her persistent back issues she's had the past few months. About a year or year and a half now of significantly increased hunger. Not able to exercise apart from the PT she's recently started for her back. Notes she gets doesn't eat super often, but when she does eat about 5 minutes later will be painfully hungry again. Only change in medications within that time frame was addition of nortriptyline for migraine management. Has gained 20-30 lb the past year and a half. Of note, she states nearly 15 lb of that was gained at the start of the pandemic due to being locked inside  more and unable to be as active. Has not followed a particular diet, does not exercise due to back issues, and has not ever tried any therapies for weight loss in the past.   Relevant past medical, surgical, family and social history reviewed and updated as indicated. Interim medical history since our last visit reviewed. Allergies and medications reviewed and updated.  Review of Systems  Per HPI unless specifically indicated above     Objective:    Temp 98.6 F (37 C) (Oral)   Ht 5\' 7"  (1.702 m)   Wt 272 lb (123.4 kg)   BMI 42.60 kg/m   Wt Readings from Last 3 Encounters:  10/26/19 272 lb (123.4 kg)  09/13/19 279 lb (126.6 kg)  08/26/19 250 lb (113.4 kg)    Physical Exam Vitals and nursing note reviewed.  Constitutional:      General: She is not in acute distress.    Appearance: Normal appearance. She is obese.  HENT:     Head: Atraumatic.     Right Ear: External ear normal.     Left Ear: External ear normal.     Nose: Nose normal. No congestion.     Mouth/Throat:     Mouth: Mucous membranes are moist.     Pharynx: Oropharynx is clear. No posterior oropharyngeal erythema.  Eyes:     Extraocular Movements: Extraocular movements intact.  Conjunctiva/sclera: Conjunctivae normal.  Cardiovascular:     Comments: Unable to assess via virtual visit Pulmonary:     Effort: Pulmonary effort is normal. No respiratory distress.  Musculoskeletal:        General: Normal range of motion.     Cervical back: Normal range of motion.  Skin:    General: Skin is dry.     Findings: No erythema.  Neurological:     Mental Status: She is alert and oriented to person, place, and time.  Psychiatric:        Mood and Affect: Mood normal.        Thought Content: Thought content normal.        Judgment: Judgment normal.     Results for orders placed or performed in visit on 09/24/19  Novel Coronavirus, NAA ([facility])   Specimen: Nasopharyngeal(NP) swabs in vial transport medium    NASOPHARYNGE  TESTING  Result Value Ref Range   SARS-CoV-2, NAA Not Detected Not Detected      Assessment & Plan:   Problem List Items Addressed This Visit      Cardiovascular and Mediastinum   Migraine    Stop nortriptyline, start topamax in case it was causing weight gain. Monitor closely for benefit with migraines      Relevant Medications   topiramate (TOPAMAX) 25 MG tablet     Other   Obesity - Primary    Suspect largely related to sedentary lifestyle and increased appetite. Will taper off nortriptyline in case this is what has worsened her appetite, swap for topamax which will hopefully decrease her appetite but still control migraines. Discussed portion control, reducing sugar/carbohydrate intake, low impact exercises that will be easier on back          Follow up plan: Return in about 4 weeks (around 11/23/2019) for weight f/u.

## 2019-10-29 NOTE — Assessment & Plan Note (Signed)
Suspect largely related to sedentary lifestyle and increased appetite. Will taper off nortriptyline in case this is what has worsened her appetite, swap for topamax which will hopefully decrease her appetite but still control migraines. Discussed portion control, reducing sugar/carbohydrate intake, low impact exercises that will be easier on back

## 2019-10-29 NOTE — Assessment & Plan Note (Signed)
Stop nortriptyline, start topamax in case it was causing weight gain. Monitor closely for benefit with migraines

## 2019-11-17 DIAGNOSIS — M545 Low back pain: Secondary | ICD-10-CM | POA: Diagnosis not present

## 2019-12-08 ENCOUNTER — Ambulatory Visit: Payer: Medicaid Other | Admitting: Family Medicine

## 2019-12-13 DIAGNOSIS — M545 Low back pain: Secondary | ICD-10-CM | POA: Diagnosis not present

## 2019-12-15 ENCOUNTER — Ambulatory Visit (INDEPENDENT_AMBULATORY_CARE_PROVIDER_SITE_OTHER): Payer: Medicaid Other | Admitting: Family Medicine

## 2019-12-15 ENCOUNTER — Encounter: Payer: Self-pay | Admitting: Family Medicine

## 2019-12-15 VITALS — Temp 98.3°F | Ht 67.0 in | Wt 273.0 lb

## 2019-12-15 DIAGNOSIS — Z6841 Body Mass Index (BMI) 40.0 and over, adult: Secondary | ICD-10-CM | POA: Diagnosis not present

## 2019-12-15 DIAGNOSIS — G43009 Migraine without aura, not intractable, without status migrainosus: Secondary | ICD-10-CM

## 2019-12-15 MED ORDER — TOPIRAMATE 50 MG PO TABS: 50.0000 mg | ORAL_TABLET | Freq: Two times a day (BID) | ORAL | 0 refills | Status: AC

## 2019-12-15 NOTE — Progress Notes (Signed)
Temp 98.3 F (36.8 C) (Oral)   Ht 5\' 7"  (1.702 m)   Wt 273 lb (123.8 kg)   BMI 42.76 kg/m    Subjective:    Patient ID: Angela Douglas, female    DOB: 1986-10-16, 34 y.o.   MRN: [id number]  HPI: Angela Douglas is a 34 y.o. female  Chief Complaint  Patient presents with  . Weight Check    . This visit was completed via WebEx due to the restrictions of the COVID-19 pandemic. All issues as above were discussed and addressed. Physical exam was done as above through visual confirmation on WebEx. If it was felt that the patient should be evaluated in the office, they were directed there. The patient verbally consented to this visit. . Location of the patient: home . Location of the provider: work . Those involved with this call:  . Provider: Merrie Roof, PA-C . CMA: Lesle Chris, [place] . Front Desk/Registration: Jill Side  . Time spent on call: 15 minutes with patient face to face via video conference. More than 50% of this time was spent in counseling and coordination of care. 5 minutes total spent in review of patient's record and preparation of their chart. I verified patient identity using two factors (patient name and date of birth). Patient consents verbally to being seen via telemedicine visit today.   Here today for weight and migraine f/u. Stopped nortriptyline and started topamax, notes the hunger feelings are not nearly as frequent as before and eating smaller portions. Migraines have picked up in frequency but not severe. Does have occasional tingling in hands with it but it's not often or bothersome to her.   Relevant past medical, surgical, family and social history reviewed and updated as indicated. Interim medical history since our last visit reviewed. Allergies and medications reviewed and updated.  Review of Systems  Per HPI unless specifically indicated above     Objective:    Temp 98.3 F (36.8 C) (Oral)   Ht 5\' 7"  (1.702 m)   Wt 273 lb (123.8 kg)    BMI 42.76 kg/m   Wt Readings from Last 3 Encounters:  12/15/19 273 lb (123.8 kg)  10/26/19 272 lb (123.4 kg)  09/13/19 279 lb (126.6 kg)    Physical Exam Vitals and nursing note reviewed.  Constitutional:      General: She is not in acute distress.    Appearance: Normal appearance.  HENT:     Head: Atraumatic.     Right Ear: External ear normal.     Left Ear: External ear normal.     Nose: Nose normal. No congestion.     Mouth/Throat:     Mouth: Mucous membranes are moist.     Pharynx: Oropharynx is clear. No posterior oropharyngeal erythema.  Eyes:     Extraocular Movements: Extraocular movements intact.     Conjunctiva/sclera: Conjunctivae normal.  Cardiovascular:     Comments: Unable to assess via virtual visit Pulmonary:     Effort: Pulmonary effort is normal. No respiratory distress.  Musculoskeletal:        General: Normal range of motion.     Cervical back: Normal range of motion.  Skin:    General: Skin is dry.     Findings: No erythema.  Neurological:     Mental Status: She is alert and oriented to person, place, and time.  Psychiatric:        Mood and Affect: Mood normal.        Thought  Content: Thought content normal.        Judgment: Judgment normal.     Results for orders placed or performed in visit on 09/24/19  Novel Coronavirus, NAA ([facility])   Specimen: Nasopharyngeal(NP) swabs in vial transport medium   NASOPHARYNGE  TESTING  Result Value Ref Range   SARS-CoV-2, NAA Not Detected Not Detected      Assessment & Plan:   Problem List Items Addressed This Visit      Cardiovascular and Mediastinum   Migraine    Migraines more frequent but not severe. Will increase dose and monitor closely for worsening side effects or no improvement in migraines. Continue prn imitrex for breakthrough      Relevant Medications   topiramate (TOPAMAX) 50 MG tablet     Other   Obesity - Primary    Noting benefit as far as appetite and portion control with  topamax. Will increase dose, work on exercise regimen, track progress. No significant weight loss yet.           Follow up plan: Return in about 4 weeks (around 01/12/2020) for migraine, weight f/u.

## 2019-12-19 NOTE — Assessment & Plan Note (Signed)
Noting benefit as far as appetite and portion control with topamax. Will increase dose, work on exercise regimen, track progress. No significant weight loss yet.

## 2019-12-19 NOTE — Assessment & Plan Note (Signed)
Migraines more frequent but not severe. Will increase dose and monitor closely for worsening side effects or no improvement in migraines. Continue prn imitrex for breakthrough

## 2019-12-27 ENCOUNTER — Telehealth: Payer: Medicaid Other | Admitting: Physician Assistant

## 2019-12-27 DIAGNOSIS — R059 Cough, unspecified: Secondary | ICD-10-CM

## 2019-12-27 DIAGNOSIS — R0981 Nasal congestion: Secondary | ICD-10-CM | POA: Diagnosis not present

## 2019-12-27 DIAGNOSIS — R05 Cough: Secondary | ICD-10-CM

## 2019-12-27 MED ORDER — BENZONATATE 100 MG PO CAPS: 100.0000 mg | ORAL_CAPSULE | Freq: Three times a day (TID) | ORAL | 0 refills | Status: DC | PRN

## 2019-12-27 MED ORDER — IPRATROPIUM BROMIDE 0.03 % NA SOLN: 2.0000 | Freq: Three times a day (TID) | NASAL | 0 refills | Status: AC | PRN

## 2019-12-27 NOTE — Progress Notes (Signed)
We are sorry that you are not feeling well.  Here is how we plan to help!  Based on what you have shared with me it looks like you have sinusitis.  Sinusitis is inflammation and infection in the sinus cavities of the head.  Based on your presentation I believe you most likely have Acute Viral Sinusitis.This is an infection most likely caused by a virus. There is not specific treatment for viral sinusitis other than to help you with the symptoms until the infection runs its course.  You may use an oral decongestant such as Mucinex D or if you have glaucoma or high blood pressure use plain Mucinex. Saline nasal spray help and can safely be used as often as needed for congestion, I have prescribed: Ipratropium Bromide nasal spray 0.03% 2 sprays in eah nostril 2-3 times a day. I have also prescribed a medicine called Tessalon Perles to be taken up to 3 times daily as needed for cough.   Some authorities believe that zinc sprays or the use of Echinacea may shorten the course of your symptoms.  Sinus infections are not as easily transmitted as other respiratory infection, however we still recommend that you avoid close contact with loved ones, especially the very young and elderly.  Remember to wash your hands thoroughly throughout the day as this is the number one way to prevent the spread of infection!  Home Care:  Only take medications as instructed by your medical team.  Do not take these medications with alcohol.  A steam or ultrasonic humidifier can help congestion.  You can place a towel over your head and breathe in the steam from hot water coming from a faucet.  Avoid close contacts especially the very young and the elderly.  Cover your mouth when you cough or sneeze.  Always remember to wash your hands.  Get Help Right Away If:  You develop worsening fever or sinus pain.  You develop a severe head ache or visual changes.  Your symptoms persist after you have completed your treatment  plan.  Make sure you  Understand these instructions.  Will watch your condition.  Will get help right away if you are not doing well or get worse.  Your e-visit answers were reviewed by a board certified advanced clinical practitioner to complete your personal care plan.  Depending on the condition, your plan could have included both over the counter or prescription medications.  If there is a problem please reply  once you have received a response from your provider.  Your safety is important to [place].  If you have drug allergies check your prescription carefully.    You can use MyChart to ask questions about today's visit, request a non-urgent call back, or ask for a work or school excuse for 24 hours related to this e-Visit. If it has been greater than 24 hours you will need to follow up with your provider, or enter a new e-Visit to address those concerns.  You will get an e-mail in the next two days asking about your experience.  I hope that your e-visit has been valuable and will speed your recovery. Thank you for using e-visits.  Greater than 5 minutes, yet less than 10 minutes of time have been spent researching, coordinating, and implementing care for this patient today.

## 2020-01-06 DIAGNOSIS — M545 Low back pain: Secondary | ICD-10-CM | POA: Diagnosis not present

## 2020-01-12 ENCOUNTER — Telehealth (INDEPENDENT_AMBULATORY_CARE_PROVIDER_SITE_OTHER): Payer: Medicaid Other | Admitting: Family Medicine

## 2020-01-12 ENCOUNTER — Encounter: Payer: Self-pay | Admitting: Family Medicine

## 2020-01-12 ENCOUNTER — Ambulatory Visit: Payer: Medicaid Other | Admitting: Family Medicine

## 2020-01-12 VITALS — Temp 97.2°F | Ht 67.0 in | Wt 268.2 lb

## 2020-01-12 DIAGNOSIS — G43009 Migraine without aura, not intractable, without status migrainosus: Secondary | ICD-10-CM

## 2020-01-12 DIAGNOSIS — G894 Chronic pain syndrome: Secondary | ICD-10-CM | POA: Diagnosis not present

## 2020-01-12 DIAGNOSIS — Z6841 Body Mass Index (BMI) 40.0 and over, adult: Secondary | ICD-10-CM | POA: Diagnosis not present

## 2020-01-12 DIAGNOSIS — M545 Low back pain: Secondary | ICD-10-CM | POA: Diagnosis not present

## 2020-01-12 NOTE — Progress Notes (Signed)
Temp (!) 97.2 F (36.2 C) (Temporal)   Ht 5\' 7"  (1.702 m)   Wt 268 lb 4 oz (121.7 kg)   BMI 42.01 kg/m    Subjective:    Patient ID: , female    DOB: 1986/10/12, 34 y.o.   MRN: [id number]  HPI: Angela Douglas is a 34 y.o. female  Chief Complaint  Patient presents with  . Migraine  . Weight Check    . This visit was completed via telephone due to the restrictions of the COVID-19 pandemic. All issues as above were discussed and addressed. Physical exam was done as above through visual confirmation on telephone. If it was felt that the patient should be evaluated in the office, they were directed there. The patient verbally consented to this visit. . Location of the patient: home . Location of the provider: work . Those involved with this call:  . Provider: 34, PA-C . CMA: Roosvelt Maser, CMA . Front Desk/Registration: Elton Sin  . Time spent on call: 15 minutes on the phone discussing health concerns. 5 minutes total spent in review of patient's record and preparation of their chart. I verified patient identity using two factors (patient name and date of birth). Patient consents verbally to being seen via telemedicine visit today.   Patient presenting today for weight and migraine f/u since increase of topamax. Has lost a few pounds on the topamax, only taking one daily though and just realized the other day she was supposed to be taking twice daily. Wanting to give it more time now that she's increasing to twice daily. Tolerating well without side effects. Has not been following strict diet or exercise regimen as of yet.   1 migraine per week, lasting several days at a time. Imitrex does tend to help mildly.   Relevant past medical, surgical, family and social history reviewed and updated as indicated. Interim medical history since our last visit reviewed. Allergies and medications reviewed and updated.  Review of Systems  Per HPI unless  specifically indicated above     Objective:    Temp (!) 97.2 F (36.2 C) (Temporal)   Ht 5\' 7"  (1.702 m)   Wt 268 lb 4 oz (121.7 kg)   BMI 42.01 kg/m   Wt Readings from Last 3 Encounters:  01/12/20 268 lb 4 oz (121.7 kg)  12/15/19 273 lb (123.8 kg)  10/26/19 272 lb (123.4 kg)    Physical Exam  Unable to perform PE due to patient lack of access to video technology for today's visit.   Results for orders placed or performed in visit on 09/24/19  Novel Coronavirus, NAA ([facility])   Specimen: Nasopharyngeal(NP) swabs in vial transport medium   NASOPHARYNGE  TESTING  Result Value Ref Range   SARS-CoV-2, NAA Not Detected Not Detected      Assessment & Plan:   Problem List Items Addressed This Visit      Cardiovascular and Mediastinum   Migraine    Will give 1 month on BID topamax and prn imitrex. If not reducing frequency of migraines will consider emgality or aimovig        Other   Obesity - Primary    Down several lb since last visit, and wanting to give the medication time on the BID dose. Work on strict diet and exercise changes          Follow up plan: Return in about 4 weeks (around 02/09/2020) for mychart message update on migraines.

## 2020-01-18 NOTE — Assessment & Plan Note (Signed)
Down several lb since last visit, and wanting to give the medication time on the BID dose. Work on strict diet and exercise changes

## 2020-01-18 NOTE — Assessment & Plan Note (Signed)
Will give 1 month on BID topamax and prn imitrex. If not reducing frequency of migraines will consider emgality or aimovig

## 2020-01-26 DIAGNOSIS — R293 Abnormal posture: Secondary | ICD-10-CM | POA: Diagnosis not present

## 2020-01-26 DIAGNOSIS — M545 Low back pain: Secondary | ICD-10-CM | POA: Diagnosis not present

## 2020-02-02 DIAGNOSIS — R293 Abnormal posture: Secondary | ICD-10-CM | POA: Diagnosis not present

## 2020-02-02 DIAGNOSIS — M545 Low back pain: Secondary | ICD-10-CM | POA: Diagnosis not present

## 2020-02-09 DIAGNOSIS — M545 Low back pain: Secondary | ICD-10-CM | POA: Diagnosis not present

## 2020-02-09 DIAGNOSIS — G894 Chronic pain syndrome: Secondary | ICD-10-CM | POA: Diagnosis not present

## 2020-02-15 ENCOUNTER — Other Ambulatory Visit: Payer: Self-pay | Admitting: Family Medicine

## 2020-02-15 NOTE — Telephone Encounter (Signed)
Requested Prescriptions  Pending Prescriptions Disp Refills  . pantoprazole (PROTONIX) 40 MG tablet [Pharmacy Med Name: PANTOPRAZOLE SOD DR 40 MG TAB] 90 tablet 1    Sig: TAKE 1 TABLET BY MOUTH EVERY DAY     Gastroenterology: Proton Pump Inhibitors Passed - 02/15/2020 10:38 AM      Passed - Valid encounter within last 12 months    Recent Outpatient Visits          1 month ago Class 3 severe obesity due to excess calories with serious comorbidity and body mass index (BMI) of 40.0 to 44.9 in adult [facility])   [facility] Particia Nearing, PA-C   2 months ago Class 3 severe obesity due to excess calories with serious comorbidity and body mass index (BMI) of 40.0 to 44.9 in adult [facility])   [facility] Particia Nearing, PA-C   3 months ago Class 3 severe obesity due to excess calories with serious comorbidity and body mass index (BMI) of 40.0 to 44.9 in adult [facility])   [facility] Particia Nearing, PA-C   5 months ago Pain, dental   [facility], [place] T, NP   5 months ago Acute left-sided low back pain without sciatica   [facility] Particia Nearing, [place]

## 2020-03-30 DIAGNOSIS — Z03818 Encounter for observation for suspected exposure to other biological agents ruled out: Secondary | ICD-10-CM | POA: Diagnosis not present

## 2020-04-07 DIAGNOSIS — M545 Low back pain: Secondary | ICD-10-CM | POA: Diagnosis not present

## 2020-04-07 DIAGNOSIS — G894 Chronic pain syndrome: Secondary | ICD-10-CM | POA: Diagnosis not present

## 2020-05-01 ENCOUNTER — Other Ambulatory Visit: Payer: Self-pay

## 2020-05-01 ENCOUNTER — Encounter: Payer: Self-pay | Admitting: Family Medicine

## 2020-05-01 ENCOUNTER — Ambulatory Visit (INDEPENDENT_AMBULATORY_CARE_PROVIDER_SITE_OTHER): Payer: Medicaid Other | Admitting: Family Medicine

## 2020-05-01 VITALS — BP 117/77 | HR 72 | Temp 98.4°F | Wt 265.0 lb

## 2020-05-01 DIAGNOSIS — L600 Ingrowing nail: Secondary | ICD-10-CM

## 2020-05-01 MED ORDER — SULFAMETHOXAZOLE-TRIMETHOPRIM 800-160 MG PO TABS: 1.0000 | ORAL_TABLET | Freq: Two times a day (BID) | ORAL | 0 refills | Status: DC

## 2020-05-01 NOTE — Progress Notes (Signed)
BP 117/77   Pulse 72   Temp 98.4 F (36.9 C) (Oral)   Wt 265 lb (120.2 kg)   SpO2 97%   BMI 41.50 kg/m    Subjective:    Patient ID: Angela Douglas, female    DOB: [birth year], 34 y.o.   MRN: [id number]  HPI: Angela Douglas is a 34 y.o. female  Chief Complaint  Patient presents with  . Ingrown Toenail    left big toe x a month   Left great toenail pain for about a month. Has been using antibiotic cream which initially helped but now it's discolored, draining, swollen and painful at nail edge. Has tried digging the nail out with clippers, tweezers, and soaks with minimal improvement.   Also has a boil in right underarm that started yesterday. Gets these from time to time. Denies drainage, redness, fevers. Trying to keep clean and dry.   Relevant past medical, surgical, family and social history reviewed and updated as indicated. Interim medical history since our last visit reviewed. Allergies and medications reviewed and updated.  Review of Systems  Per HPI unless specifically indicated above     Objective:    BP 117/77   Pulse 72   Temp 98.4 F (36.9 C) (Oral)   Wt 265 lb (120.2 kg)   SpO2 97%   BMI 41.50 kg/m   Wt Readings from Last 3 Encounters:  05/01/20 265 lb (120.2 kg)  01/12/20 268 lb 4 oz (121.7 kg)  12/15/19 273 lb (123.8 kg)    Physical Exam Vitals and nursing note reviewed.  Constitutional:      Appearance: Normal appearance. She is not ill-appearing.  HENT:     Head: Atraumatic.  Eyes:     Extraocular Movements: Extraocular movements intact.     Conjunctiva/sclera: Conjunctivae normal.  Cardiovascular:     Rate and Rhythm: Normal rate and regular rhythm.     Heart sounds: Normal heart sounds.  Pulmonary:     Effort: Pulmonary effort is normal.     Breath sounds: Normal breath sounds.  Musculoskeletal:        General: Tenderness (firm, tender to palpation lump right axilla) present. Normal range of motion.     Cervical back: Normal  range of motion and neck supple.  Skin:    General: Skin is warm.     Comments: Left great toenail ingrown at lateral nail edge, crusted drainage present, skin edematous and ttp  Neurological:     Mental Status: She is alert and oriented to person, place, and time.  Psychiatric:        Mood and Affect: Mood normal.        Thought Content: Thought content normal.        Judgment: Judgment normal.     Results for orders placed or performed in visit on 09/24/19  Novel Coronavirus, NAA ([facility])   Specimen: Nasopharyngeal(NP) swabs in vial transport medium   NASOPHARYNGE  TESTING  Result Value Ref Range   SARS-CoV-2, NAA Not Detected Not Detected      Assessment & Plan:   Problem List Items Addressed This Visit    None    Visit Diagnoses    Ingrown toenail of left foot    -  Primary   Bactrim sent, home care with soaks, loose shoes reviewed. Scheduled for toenail excision 1 week from today if not fully resolved       Follow up plan: Return for 11:20 Monday with Dr. Laural Benes.

## 2020-05-08 ENCOUNTER — Ambulatory Visit (INDEPENDENT_AMBULATORY_CARE_PROVIDER_SITE_OTHER): Payer: Medicaid Other | Admitting: Family Medicine

## 2020-05-08 ENCOUNTER — Encounter: Payer: Self-pay | Admitting: Family Medicine

## 2020-05-08 ENCOUNTER — Other Ambulatory Visit: Payer: Self-pay

## 2020-05-08 VITALS — BP 125/85 | HR 84 | Temp 99.4°F | Wt 269.1 lb

## 2020-05-08 DIAGNOSIS — L600 Ingrowing nail: Secondary | ICD-10-CM

## 2020-05-08 NOTE — Progress Notes (Signed)
BP 125/85   Pulse 84   Temp 99.4 F (37.4 C)   Wt 269 lb 2 oz (122.1 kg)   SpO2 99%   BMI 42.15 kg/m    Subjective:    Patient ID: Angela Douglas, female    DOB: 1986-04-09, 34 y.o.   MRN: [id number]  HPI: Angela Douglas is a 34 y.o. female  Chief Complaint  Patient presents with  . ingrown toe nail   TOE PAIN Duration: about a month Involved toe: leftbig toe  Mechanism of injury: unknown Onset: gradual Severity: moderate  Quality: aching and sore Frequency: constant Radiation: no Aggravating factors:wearing shoes, weight bearing   Alleviating factors:antibiotics   Status: stable Treatments attempted:cutting it out, antibiotics   Relief with NSAIDs?: mild Morning stiffness: no Redness: yes  Bruising: no Swelling: yes Paresthesias / decreased sensation: no Fevers: no  Relevant past medical, surgical, family and social history reviewed and updated as indicated. Interim medical history since our last visit reviewed. Allergies and medications reviewed and updated.  Review of Systems  Constitutional: Negative.   Respiratory: Negative.   Cardiovascular: Negative.   Gastrointestinal: Negative.   Musculoskeletal: Negative.   Skin: Negative.   Neurological: Negative.   Psychiatric/Behavioral: Negative.     Per HPI unless specifically indicated above     Objective:    BP 125/85   Pulse 84   Temp 99.4 F (37.4 C)   Wt 269 lb 2 oz (122.1 kg)   SpO2 99%   BMI 42.15 kg/m   Wt Readings from Last 3 Encounters:  05/08/20 269 lb 2 oz (122.1 kg)  05/01/20 265 lb (120.2 kg)  01/12/20 268 lb 4 oz (121.7 kg)    Physical Exam Vitals and nursing note reviewed.  Constitutional:      General: She is not in acute distress.    Appearance: Normal appearance. She is not ill-appearing, toxic-appearing or diaphoretic.  HENT:     Head: Normocephalic and atraumatic.     Right Ear: External ear normal.     Left Ear: External ear normal.     Nose: Nose normal.      Mouth/Throat:     Mouth: Mucous membranes are moist.     Pharynx: Oropharynx is clear.  Eyes:     General: No scleral icterus.       Right eye: No discharge.        Left eye: No discharge.     Extraocular Movements: Extraocular movements intact.     Conjunctiva/sclera: Conjunctivae normal.     Pupils: Pupils are equal, round, and reactive to light.  Cardiovascular:     Rate and Rhythm: Normal rate and regular rhythm.     Pulses: Normal pulses.     Heart sounds: Normal heart sounds. No murmur heard.  No friction rub. No gallop.   Pulmonary:     Effort: Pulmonary effort is normal. No respiratory distress.     Breath sounds: Normal breath sounds. No stridor. No wheezing, rhonchi or rales.  Chest:     Chest wall: No tenderness.  Musculoskeletal:        General: Normal range of motion.     Cervical back: Normal range of motion and neck supple.  Skin:    General: Skin is warm and dry.     Capillary Refill: Capillary refill takes less than 2 seconds.     Coloration: Skin is not jaundiced or pale.     Findings: No bruising, erythema, lesion or rash.  Comments: In grown L great toenail on the medial side, tenderness to palpation, no fluctuance  Neurological:     General: No focal deficit present.     Mental Status: She is alert and oriented to person, place, and time. Mental status is at baseline.  Psychiatric:        Mood and Affect: Mood normal.        Behavior: Behavior normal.        Thought Content: Thought content normal.        Judgment: Judgment normal.     Results for orders placed or performed in visit on 09/24/19  Novel Coronavirus, NAA ([facility])   Specimen: Nasopharyngeal(NP) swabs in vial transport medium   NASOPHARYNGE  TESTING  Result Value Ref Range   SARS-CoV-2, NAA Not Detected Not Detected      Assessment & Plan:   Problem List Items Addressed This Visit    None    Visit Diagnoses    Ingrown toenail of left foot    -  Primary   Toenail partially  removed as below. Call with any concerns. Continue to monitor. Out of work for 1 week.       Procedure: Partial Toenail removal with Matrix Diagnosis:    ICD-10-CM   1. Ingrown toenail of left foot  L60.0    Toenail partially removed as below. Call with any concerns. Continue to monitor. Out of work for 1 week.    Physican: Park Liter, DO Consent: Risks, benefits, and alternative treatments discussed and all questions were answered.  Patient elected to proceed and verbal consent obtained Description:  Area prepped and draped using  sterile technique. Digital block of the  Left  1st toe performed by injecting local anesthetic at the base of the toe at the 2 oclock and 10 oclock positions, using 10 cc's of  1% lidocaine plain. After confirming adequate anesthesia, medial nail folds and epinychia were freed up using periosteal elevator.  Using scissors the nail was vertically cut beyond the epinychia to the base.  A hemostat was then used to remove the nail fragment. The nail was grasped using a hemostat and the nail was removed intact. Matrix was cauterized using electrocautery.   Bacitracin ointment was applied to the operative site a circumferential gauze dressive was applied.  The patient tolerated the procedure well.  Complications: none Estimated Blood Loss: minimal Post Procedure Instructions: The patient was encouraged to keep the dressing in place for 24 hours and keep the foot elevated as much as possible during this time.  After the first day they are instructed to soak the toe in warm water 3 times daily for 3-4 days.  Antibiotic ointment is to be applied daily for 1 week.  The patient was informed that some oozing is to be expected for 1-2 weeks but that they should return immediately for pus, increased pain or redness.  They were instructed to take APAP or motrin as needed for post operative discomfort.    Follow up plan: Return if symptoms worsen or fail to improve.

## 2020-05-08 NOTE — Patient Instructions (Signed)
Fingernail or Toenail Removal, Adult, Care After °This sheet gives you information about how to care for yourself after your procedure. Your health care provider may also give you more specific instructions. If you have problems or questions, contact your health care provider. °What can I expect after the procedure? °After the procedure, it is common to have: °· Pain. °· Redness. °· Swelling. °· Soreness. °Follow these instructions at home: °Medicines °· Take over-the-counter and prescription medicines only as told by your health care provider. °· If you were prescribed an antibiotic medicine, take or apply it as told by your health care provider. Do not stop using the antibiotic even if you start to feel better. °Wound care °· Follow instructions from your health care provider about how to take care of your wound. Make sure you: °? Wash your hands with soap and water for at least 20 seconds before and after you change your bandage (dressing). If soap and water are not available, use hand sanitizer. °? Change your dressing as told by your health care provider. °? Keep your dressing dry until your health care provider says it can be removed. °· Check your wound every day for signs of infection. Check for: °? More redness, swelling, or pain. °? More fluid or blood. °? Warmth. °? Pus or a bad smell. °If you have a splint: ° °· Wear the splint as told by your health care provider. Remove it only as told by your health care provider. °· Loosen the splint if your fingers tingle, become numb, or turn cold and blue. °· Keep the splint clean. °· If the splint is not waterproof: °? Do not let it get wet. °? Cover it with a watertight covering when you take a bath or a shower. °Managing pain, stiffness, and swelling °· Move your fingers or toes often to reduce stiffness and swelling. °· Raise (elevate) the injured area above the level of your heart while you are sitting or lying down. You may need to keep your hand or foot  raised or supported on a pillow for 24 hours or as told by your health care provider. °General instructions °· If you were given a shoe to wear, wear it as told by your health care provider. °· Keep all follow-up visits as told by your health care provider. This is important. °Contact a health care provider if: °· You have more redness, swelling, or pain around your wound. °· You have more fluid or blood coming from your wound. °· You have pus or a bad smell coming from your wound. °· Your wound feels warm to the touch. °· You have a fever. °Get help right away if: °· Your finger or toe looks pale, blue, or black. °· You are not able to move your finger or toe. °Summary °· After the procedure, it is common to have pain and swelling. °· Keep the hand or foot raised (elevated) or supported on a pillow as told by your health care provider. °· Take over-the-counter and prescription medicines only as told by your health care provider. °· Check your wound every day for signs of infection. °This information is not intended to replace advice given to you by your health care provider. Make sure you discuss any questions you have with your health care provider. °Document Revised: 06/21/2019 Document Reviewed: 06/21/2019 °Elsevier Patient Education © 2020 [employer] ° °

## 2020-05-31 ENCOUNTER — Encounter: Payer: Self-pay | Admitting: Family Medicine

## 2020-06-02 ENCOUNTER — Telehealth (INDEPENDENT_AMBULATORY_CARE_PROVIDER_SITE_OTHER): Payer: Medicaid Other | Admitting: Family Medicine

## 2020-06-02 ENCOUNTER — Encounter: Payer: Self-pay | Admitting: Family Medicine

## 2020-06-02 VITALS — Temp 97.6°F | Wt 269.0 lb

## 2020-06-02 DIAGNOSIS — R7301 Impaired fasting glucose: Secondary | ICD-10-CM | POA: Diagnosis not present

## 2020-06-02 DIAGNOSIS — Z6841 Body Mass Index (BMI) 40.0 and over, adult: Secondary | ICD-10-CM

## 2020-06-02 DIAGNOSIS — M545 Low back pain, unspecified: Secondary | ICD-10-CM

## 2020-06-02 NOTE — Assessment & Plan Note (Signed)
Recheck A1C, discussed possibly adding ozempic to help with this and weight goals. Lifestyle changes reviewed

## 2020-06-02 NOTE — Assessment & Plan Note (Signed)
Struggling to lose weight though she feels the topamax has been helping. If A1C still elevated will consider adding ozempic to further help with this. Discussed low impact exercise such as swimming, ellipticals and diet changes

## 2020-06-02 NOTE — Progress Notes (Signed)
Temp 97.6 F (36.4 C) (Oral)   Wt (!) 269 lb (122 kg)   BMI 42.13 kg/m    Subjective:    Patient ID: Angela Douglas, female    DOB: 1985/11/15, 34 y.o.   MRN: [id number]  HPI: Angela Douglas is a 34 y.o. female  Chief Complaint  Patient presents with  . Back Pain    pt states she needs a work note stating she would not be able to do her regular job as usual due to back pain    . This visit was completed via MyChart due to the restrictions of the COVID-19 pandemic. All issues as above were discussed and addressed. Physical exam was done as above through visual confirmation on MyChart. If it was felt that the patient should be evaluated in the office, they were directed there. The patient verbally consented to this visit. . Location of the patient: home . Location of the provider: work . Those involved with this call:  . Provider: Roosvelt Maser, PA-C . CMA: Elton Sin, CMA . Front Desk/Registration: Adela Ports  . Time spent on call: 15 minutes with patient face to face via video conference. More than 50% of this time was spent in counseling and coordination of care. 5 minutes total spent in review of patient's record and preparation of their chart. I verified patient identity using two factors (patient name and date of birth). Patient consents verbally to being seen via telemedicine visit today.   Presenting today for continued low back pain that is exacerbated by work activities. Following with Orthopedics for this but states she cannot have the surgery recommended until she loses significant amounts of weight which she has been attempting to do. Taking the medications faithfully as directed by Orthopedics. Unable to afford further chiropractic care at this time. Requesting extension of her light duty orders from specialist.   Also overdue for A1C, concerned due to excessive thirst and frequent urination that her sugars are up.   Relevant past medical, surgical, family  and social history reviewed and updated as indicated. Interim medical history since our last visit reviewed. Allergies and medications reviewed and updated.  Review of Systems  Per HPI unless specifically indicated above     Objective:    Temp 97.6 F (36.4 C) (Oral)   Wt (!) 269 lb (122 kg)   BMI 42.13 kg/m   Wt Readings from Last 3 Encounters:  06/02/20 (!) 269 lb (122 kg)  05/08/20 269 lb 2 oz (122.1 kg)  05/01/20 265 lb (120.2 kg)    Physical Exam Vitals and nursing note reviewed.  Constitutional:      General: She is not in acute distress.    Appearance: Normal appearance.  HENT:     Head: Atraumatic.     Right Ear: External ear normal.     Left Ear: External ear normal.     Nose: Nose normal. No congestion.     Mouth/Throat:     Mouth: Mucous membranes are moist.     Pharynx: Oropharynx is clear. No posterior oropharyngeal erythema.  Eyes:     Extraocular Movements: Extraocular movements intact.     Conjunctiva/sclera: Conjunctivae normal.  Cardiovascular:     Comments: Unable to assess via virtual visit Pulmonary:     Effort: Pulmonary effort is normal. No respiratory distress.  Musculoskeletal:     Cervical back: Normal range of motion.     Comments: Laying down during visit and given virtual nature unable to fully assess  orthopedic exam  Skin:    General: Skin is dry.     Findings: No erythema.  Neurological:     Mental Status: She is alert and oriented to person, place, and time.  Psychiatric:        Mood and Affect: Mood normal.        Thought Content: Thought content normal.        Judgment: Judgment normal.     Results for orders placed or performed in visit on 09/24/19  Novel Coronavirus, NAA ([facility])   Specimen: Nasopharyngeal(NP) swabs in vial transport medium   NASOPHARYNGE  TESTING  Result Value Ref Range   SARS-CoV-2, NAA Not Detected Not Detected      Assessment & Plan:   Problem List Items Addressed This Visit      Endocrine    IFG (impaired fasting glucose)    Recheck A1C, discussed possibly adding ozempic to help with this and weight goals. Lifestyle changes reviewed      Relevant Orders   HgB A1c     Other   Obesity    Struggling to lose weight though she feels the topamax has been helping. If A1C still elevated will consider adding ozempic to further help with this. Discussed low impact exercise such as swimming, ellipticals and diet changes       Other Visit Diagnoses    Acute midline low back pain, unspecified whether sciatica present    -  Primary   Followed by Orthopedics but unable to have surgery at this time. Light duty letter given, continue working on weight loss, stretches, exercises       Follow up plan: Return if symptoms worsen or fail to improve.

## 2020-08-01 ENCOUNTER — Encounter: Payer: Self-pay | Admitting: Family Medicine

## 2020-08-01 DIAGNOSIS — J358 Other chronic diseases of tonsils and adenoids: Secondary | ICD-10-CM

## 2020-08-22 ENCOUNTER — Other Ambulatory Visit: Payer: Self-pay | Admitting: Family Medicine

## 2020-08-22 NOTE — Telephone Encounter (Signed)
Requested Prescriptions  Pending Prescriptions Disp Refills  . pantoprazole (PROTONIX) 40 MG tablet [Pharmacy Med Name: PANTOPRAZOLE SOD DR 40 MG TAB] 90 tablet 1    Sig: TAKE 1 TABLET BY MOUTH EVERY DAY     Gastroenterology: Proton Pump Inhibitors Passed - 08/22/2020 12:46 PM      Passed - Valid encounter within last 12 months    Recent Outpatient Visits          2 months ago Acute midline low back pain, unspecified whether sciatica present   [facility] Particia Nearing, PA-C   3 months ago Ingrown toenail of left foot   [facility] Force, Megan P, DO   3 months ago Ingrown toenail of left foot   [facility], [place], [place]   7 months ago Class 3 severe obesity due to excess calories with serious comorbidity and body mass index (BMI) of 40.0 to 44.9 in adult [facility])   [facility] Particia Nearing, PA-C   8 months ago Class 3 severe obesity due to excess calories with serious comorbidity and body mass index (BMI) of 40.0 to 44.9 in adult [facility])   [facility] Particia Nearing, [place]

## 2020-12-25 ENCOUNTER — Other Ambulatory Visit: Payer: Self-pay | Admitting: Family Medicine

## 2020-12-25 NOTE — Telephone Encounter (Signed)
Requested medication (s) are due for refill today:   Provider to determine  Requested medication (s) are on the active medication list:   Yes  Future visit scheduled:   No    Last ordered: 12/15/2019 #60, 0 refills  Clinic note:   Returned because this is a non delegated refill   Requested Prescriptions  Pending Prescriptions Disp Refills   topiramate (TOPAMAX) 50 MG tablet [Pharmacy Med Name: TOPIRAMATE 50 MG TABLET] 60 tablet 0    Sig: TAKE 1 TABLET BY MOUTH TWICE A DAY      Not Delegated - Neurology: Anticonvulsants - topiramate & zonisamide Failed - 12/25/2020  9:35 AM      Failed - This refill cannot be delegated      Failed - Cr in normal range and within 360 days    Creatinine, Ser  Date Value Ref Range Status  08/07/2018 0.86 0.57 - 1.00 mg/dL Final          Failed - CO2 in normal range and within 360 days    CO2  Date Value Ref Range Status  08/07/2018 22 20 - 29 mmol/L Final          Passed - Valid encounter within last 12 months    Recent Outpatient Visits           6 months ago Acute midline low back pain, unspecified whether sciatica present   [facility] Particia Nearing, PA-C   7 months ago Ingrown toenail of left foot   [facility] Donovan, Barney, DO   7 months ago Ingrown toenail of left foot   [facility], [place], [place]   11 months ago Class 3 severe obesity due to excess calories with serious comorbidity and body mass index (BMI) of 40.0 to 44.9 in adult [facility])   [facility] Particia Nearing, PA-C   1 year ago Class 3 severe obesity due to excess calories with serious comorbidity and body mass index (BMI) of 40.0 to 44.9 in adult [facility])   [facility] Particia Nearing, [place]

## 2020-12-25 NOTE — Telephone Encounter (Signed)
Lvm to make apt.  

## 2020-12-25 NOTE — Telephone Encounter (Signed)
Patient needs appointment before refill

## 2020-12-26 NOTE — Telephone Encounter (Signed)
Pt scheduled for 12/27/2020 the patient stated she would be moving as well due to evection .

## 2020-12-26 NOTE — Telephone Encounter (Signed)
Unable to done message as outstanding orders. Pt scheduled for apt and verbalized understanding,

## 2020-12-27 ENCOUNTER — Ambulatory Visit: Payer: Medicaid Other | Admitting: Nurse Practitioner

## 2020-12-27 NOTE — Progress Notes (Deleted)
   There were no vitals taken for this visit.   Subjective:    Patient ID: Angela Douglas, female    DOB: [birth year], 35 y.o.   MRN: [id number]  HPI: Angela Douglas is a 35 y.o. female  No chief complaint on file.  Patient presents to clinic today for routine follow up and medication refill.  ASTHMA Asthma status: {Blank single:19197::"controlled","uncontrolled","better","worse","exacerbated","stable"} Satisfied with current treatment?: {Blank single:19197::"yes","no"} Albuterol/rescue inhaler frequency:  Dyspnea frequency:  Wheezing frequency: Cough frequency:  Nocturnal symptom frequency:  Limitation of activity: {Blank single:19197::"yes","no"} Current upper respiratory symptoms: {Blank single:19197::"yes","no"} Triggers:  Home peak flows: Last Spirometry:  Failed/intolerant to following asthma meds:  Asthma meds in past:  Aerochamber/spacer use: {Blank single:19197::"yes","no"} Visits to ER or Urgent Care in past year: {Blank single:19197::"yes","no"} Pneumovax: {Blank single:19197::"Up to Date","Not up to Date","unknown"} Influenza: {Blank single:19197::"Up to Date","Not up to Date","unknown"}  MIGRAINES Duration: {Blank single:19197::"chronic","days","weeks","months","years"} Onset: {Blank single:19197::"sudden","gradual"} Severity: {Blank single:19197::"mild","moderate","severe","1/10","2/10","3/10","4/10","5/10","6/10","7/10","8/10","9/10","10/10"} Quality: {Blank multiple:19196::"sharp","dull","aching","burning","cramping","ill-defined","itchy","pressure-like","pulling","shooting","sore","stabbing","tender","tearing","throbbing"} Frequency: {Blank single:19197::"constant","intermittent","occasional","rare","every few minutes","a few times a hour","a few times a day","a few times a week","a few times a month","a few times a year"} Location:  Headache duration: Radiation: {Blank single:19197::"yes","no"} Time of day headache occurs:  Alleviating factors:   Aggravating factors:  Headache status at time of visit: {Blank single:19197::"current headache","asymptomatic"} Treatments attempted: Treatments attempted: {Blank multiple:19196::"none","rest","ice","heat","APAP","ibuprofen","aleve", excedrine","triptans","propranolol","topamax","amitriptyline"}   Aura: {Blank single:19197::"yes","no"} Nausea:  {Blank single:19197::"yes","no"} Vomiting: {Blank single:19197::"yes","no"} Photophobia:  {Blank single:19197::"yes","no"} Phonophobia:  {Blank single:19197::"yes","no"} Effect on social functioning:  {Blank single:19197::"yes","no"} Numbers of missed days of school/work each month:  Confusion:  {Blank single:19197::"yes","no"} Gait disturbance/ataxia:  {Blank single:19197::"yes","no"} Behavioral changes:  {Blank single:19197::"yes","no"} Fevers:  {Blank single:19197::"yes","no"}  Relevant past medical, surgical, family and social history reviewed and updated as indicated. Interim medical history since our last visit reviewed. Allergies and medications reviewed and updated.  Review of Systems  Per HPI unless specifically indicated above     Objective:    There were no vitals taken for this visit.  Wt Readings from Last 3 Encounters:  06/02/20 (!) 269 lb (122 kg)  05/08/20 269 lb 2 oz (122.1 kg)  05/01/20 265 lb (120.2 kg)    Physical Exam  Results for orders placed or performed in visit on 09/24/19  Novel Coronavirus, NAA ([facility])   Specimen: Nasopharyngeal(NP) swabs in vial transport medium   NASOPHARYNGE  TESTING  Result Value Ref Range   SARS-CoV-2, NAA Not Detected Not Detected      Assessment & Plan:   Problem List Items Addressed This Visit   None      Follow up plan: No follow-ups on file.

## 2021-03-19 ENCOUNTER — Ambulatory Visit: Payer: Self-pay

## 2021-03-19 ENCOUNTER — Encounter: Payer: Self-pay | Admitting: Physician Assistant

## 2021-03-19 ENCOUNTER — Ambulatory Visit (LOCAL_COMMUNITY_HEALTH_CENTER): Payer: Medicaid Other | Admitting: Physician Assistant

## 2021-03-19 VITALS — BP 119/77 | HR 59 | Ht 67.0 in | Wt 258.0 lb

## 2021-03-19 DIAGNOSIS — Z3046 Encounter for surveillance of implantable subdermal contraceptive: Secondary | ICD-10-CM

## 2021-03-19 DIAGNOSIS — Z3009 Encounter for other general counseling and advice on contraception: Secondary | ICD-10-CM | POA: Diagnosis not present

## 2021-03-19 DIAGNOSIS — Z0000 Encounter for general adult medical examination without abnormal findings: Secondary | ICD-10-CM

## 2021-03-19 DIAGNOSIS — Z113 Encounter for screening for infections with a predominantly sexual mode of transmission: Secondary | ICD-10-CM

## 2021-03-19 DIAGNOSIS — F32A Depression, unspecified: Secondary | ICD-10-CM

## 2021-03-20 ENCOUNTER — Encounter: Payer: Self-pay | Admitting: Physician Assistant

## 2021-03-20 NOTE — Progress Notes (Signed)
[facility] [address] Main Number: [phone]    Family Planning Visit- Initial Visit  Subjective:  Angela Douglas is a 35 y.o.  [id number]   being seen today for an initial annual visit and to discuss contraceptive options.  The patient is currently using Nexplanon for pregnancy prevention. Patient reports Angela Douglas does not know want a pregnancy in the next year.  Patient has the following medical conditions has IFG (impaired fasting glucose); Asthma; Migraine; Obesity; and Pain, dental on their problem list.  Chief Complaint  Patient presents with  . Contraception    Annual exam and discuss BCM.    Patient reports that Angela Douglas thought it was time for her Nexplanon removal/reinsertion.  Patient states that Angela Douglas has a PCP at [facility] and had her last pap there in June of 2021.  Patient states that Angela Douglas has had increased thirst and urination during the last 2 years and that Angela Douglas is a pre-diabetic.  Reports dizziness for 2 days at work near meal times.  States that Angela Douglas takes IB or Tylenol for her headaches and has nausea with them.  States that Angela Douglas has a history of migraines with nausea, fever, photo and phonophobia.  Concerned about weight gain and has made changes in her diet recently to help with weight loss. Patient states that Angela Douglas has been having some irregular bleeding during the last 6 weeks or so.  PHQ-9=23 with suicidal ideation but without any plan or means to hurt self or other person.    Patient denies any other concerns.   Body mass index is 40.41 kg/m. - Patient is eligible for diabetes screening based on BMI and age >81?  not applicable HA1C ordered? not applicable  Patient reports 1  partner/s in last year. Desires STI screening?  No - patient declines.  Has patient been screened once for HCV in the past?  No  No results found for: HCVAB  Does the patient have current drug use (including MJ), have a  partner with drug use, and/or has been incarcerated since last result? No  If yes-- Screen for HCV through [facility]   Does the patient meet criteria for HBV testing? No  Criteria:  -Household, sexual or needle sharing contact with HBV -History of drug use -HIV positive -Those with known Hep C   Health Maintenance Due  Topic Date Due  . COVID-19 Vaccine (1) Never done  . OPHTHALMOLOGY EXAM  Never done  . FOOT EXAM  08/08/2019  . URINE MICROALBUMIN  08/08/2019  . HEMOGLOBIN A1C  09/16/2019  . PAP SMEAR-Modifier  03/26/2020    Review of Systems  All other systems reviewed and are negative.   The following portions of the patient's history were reviewed and updated as appropriate: allergies, current medications, past family history, past medical history, past social history, past surgical history and problem list. Problem list updated.   See flowsheet for other program required questions.  Objective:   Vitals:   03/19/21 0835  BP: 119/77  Pulse: (!) 59  Weight: 258 lb (117 kg)  Height: 5\' 7"  (1.702 m)    Physical Exam Vitals and nursing note reviewed.  Constitutional:      General: Angela Douglas is not in acute distress.    Appearance: Normal appearance.  HENT:     Head: Normocephalic and atraumatic.     Mouth/Throat:     Mouth: Mucous membranes are moist.     Pharynx: Oropharynx is  clear. No oropharyngeal exudate or posterior oropharyngeal erythema.  Eyes:     Conjunctiva/sclera: Conjunctivae normal.  Neck:     Thyroid: No thyroid mass, thyromegaly or thyroid tenderness.  Cardiovascular:     Rate and Rhythm: Normal rate and regular rhythm.  Pulmonary:     Effort: Pulmonary effort is normal.     Breath sounds: Normal breath sounds.  Abdominal:     Palpations: Abdomen is soft. There is no mass.     Tenderness: There is no abdominal tenderness. There is no guarding or rebound.  Musculoskeletal:     Cervical back: Neck supple. No tenderness.  Lymphadenopathy:      Cervical: No cervical adenopathy.  Skin:    General: Skin is warm and dry.     Findings: No bruising, erythema, lesion or rash.  Neurological:     Mental Status: Angela Douglas is alert and oriented to person, place, and time.  Psychiatric:        Mood and Affect: Mood normal.        Behavior: Behavior normal.        Thought Content: Thought content normal.        Judgment: Judgment normal.       Assessment and Plan:  Vernesha Talbot is a 35 y.o. female presenting to the [facility] Department for an initial annual wellness/contraceptive visit  Contraception counseling: Reviewed all forms of birth control options in the tiered based approach. available including abstinence; over the counter/barrier methods; hormonal contraceptive medication including pill, patch, ring, injection,contraceptive implant, ECP; hormonal and nonhormonal IUDs; permanent sterilization options including vasectomy and the various tubal sterilization modalities. Risks, benefits, and typical effectiveness rates were reviewed.  Questions were answered.  Written information was also given to the patient to review.  Patient desires to continue with current Nexplanon. Angela Douglas will follow up in  1 year and prn for surveillance.  Angela Douglas was told to call with any further questions, or with any concerns about this method of contraception.  Emphasized use of condoms 100% of the time for STI prevention.  Patient was not a candidate for ECP today.   1. Encounter for counseling regarding contraception Reviewed with patient normal SE of Nexplanon and when to call clinic for concerns. Counseled patient to use condoms with all sex.  2. Well woman exam (no gynecological exam) Reviewed with patient healthy habits to maintain general health. ROI to [facility] for last pap. Enc patient to follow up with PCP for further evaluation of headaches, increased thirst and urination. Enc MVI 1 po daily. Enc to establish with/  follow up with PCP for primary care concerns, age appropriate screenings and illness.  3. Encounter for surveillance of implantable subdermal contraceptive Reviewed with patient that the Nexplanon can prevent pregnancy for up to 4 years, so that Angela Douglas can keep her current one for another year. Patient opts to continue with current device for now. Patient concerned with irregular bleeding with the Nexplanon and counseled to try OTC IB 800 mg ever 8 hr with food or milk for up to 7 days in a row. Enc patient to call or RTC if bleeding persists or worsens for Nexplanon removal/reinsertion if IB does not help bleeding.  4. Screening for STD (sexually transmitted disease) Await test results.  Counseled that RN will call if needs to RTC for treatment once results are back.  - HIV/HCV [facility] - Syphilis Serology, [facility]  5. Depression, unspecified depression type Patient with PHQ-9  score of 23 and suicidal ideation without plan or means. Patient states that Angela Douglas would never harm herself due to having to take care of her children. Patient declines LCSW referral today but does accept LCSW and [employer] cards today and promised to use them if Angela Douglas needed to talk with someone.     Return in about 1 year (around 03/19/2022) for RP and prn.  No future appointments.  Matt Holmes, PA

## 2021-03-22 LAB — HM HEPATITIS C SCREENING LAB: HM Hepatitis Screen: NEGATIVE

## 2021-03-22 LAB — HM HIV SCREENING LAB: HM HIV Screening: NEGATIVE

## 2021-04-19 ENCOUNTER — Encounter: Payer: Self-pay | Admitting: Physician Assistant

## 2021-04-19 DIAGNOSIS — R768 Other specified abnormal immunological findings in serum: Secondary | ICD-10-CM | POA: Insufficient documentation

## 2021-05-10 IMAGING — CR DG LUMBAR SPINE 2-3V
1 series · 3 of 3 positions shown · non-contrast
Comparison: None.

CLINICAL DATA: Low back pain for a couple of days since the patient
slipped on a stuffed toy. Initial encounter.

EXAM:
LUMBAR SPINE - 2-3 VIEW

[Series 1: t lumbar spine ap · 0.14mm/px · 3 of 3 slices shown]
[im 1/3]
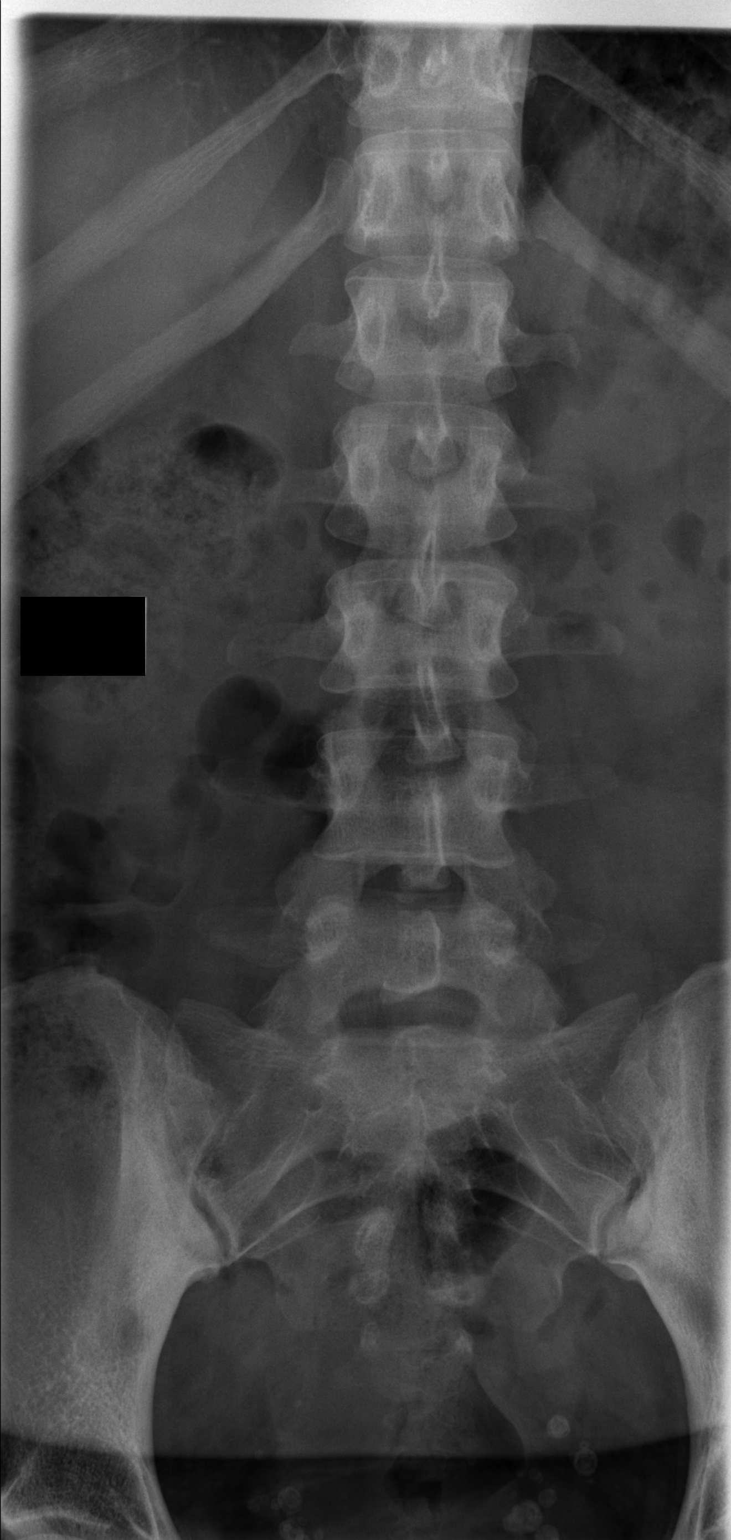
[im 2/3]
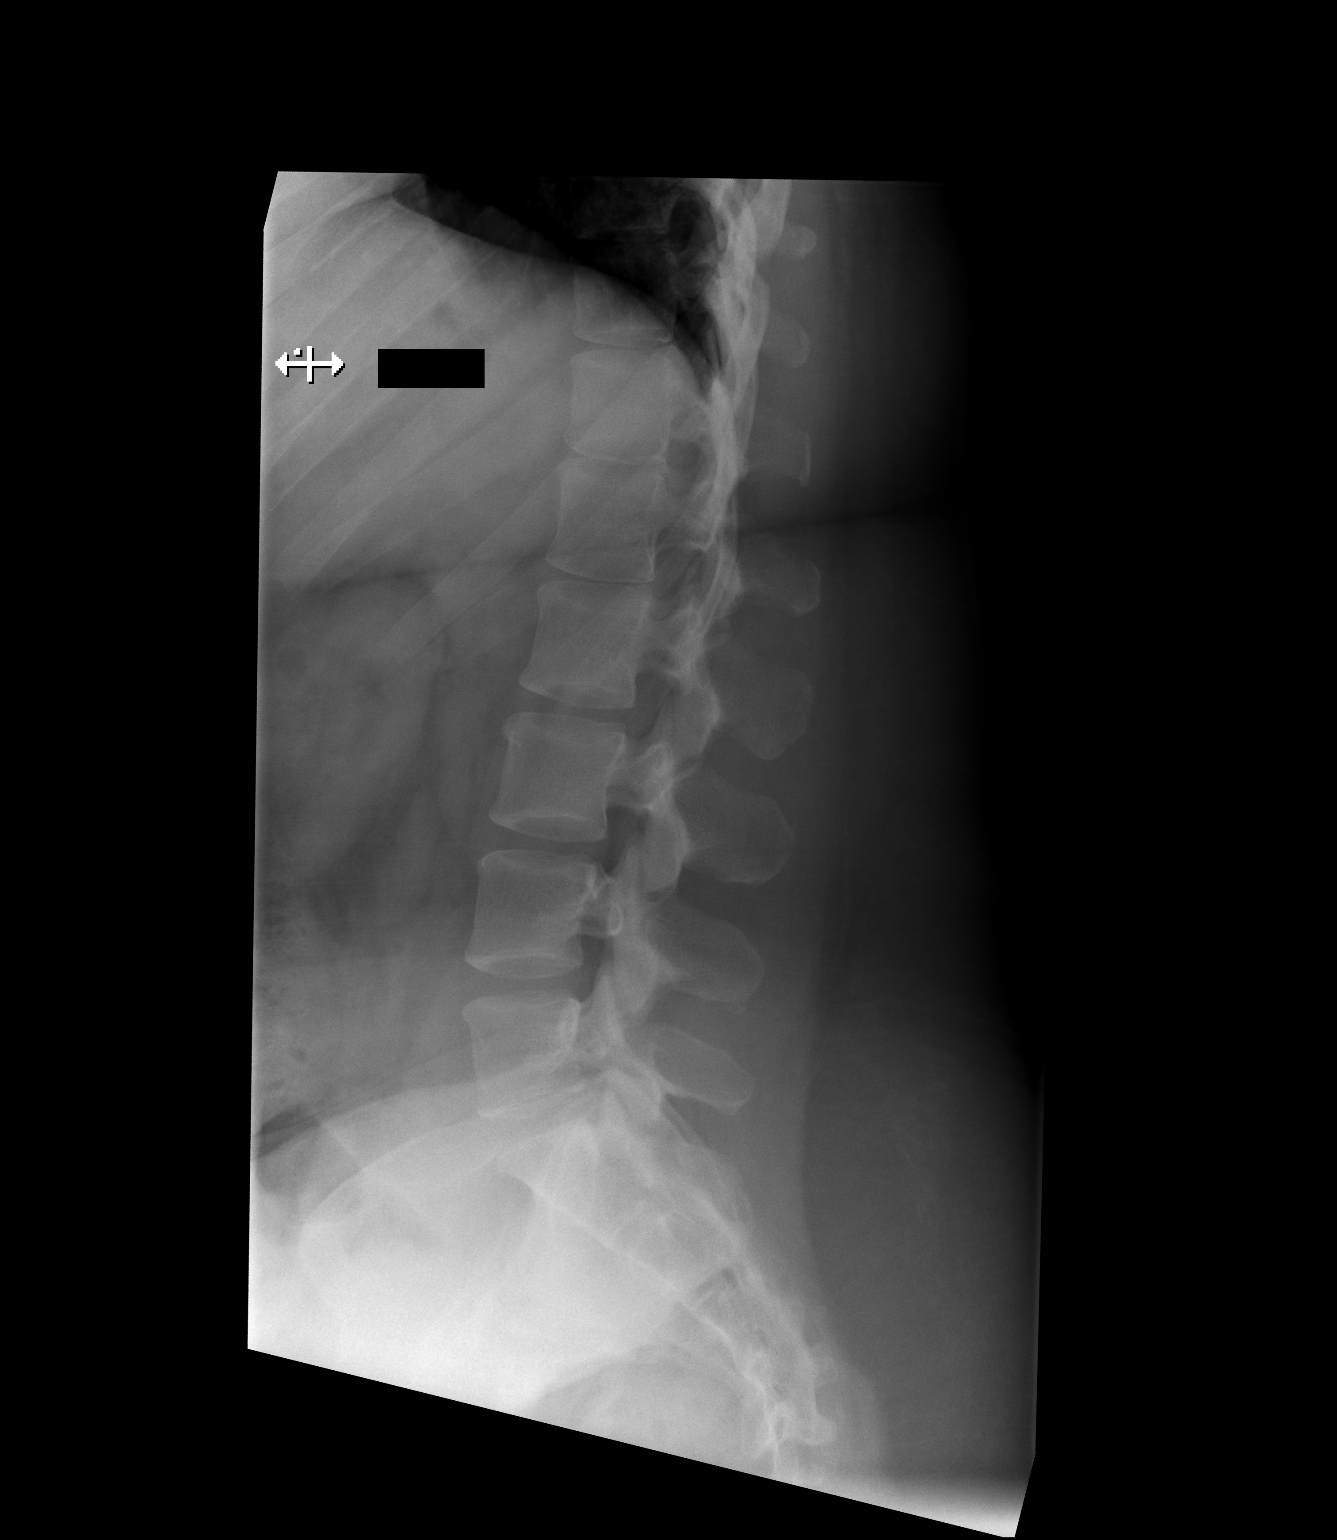
[im 3/3]
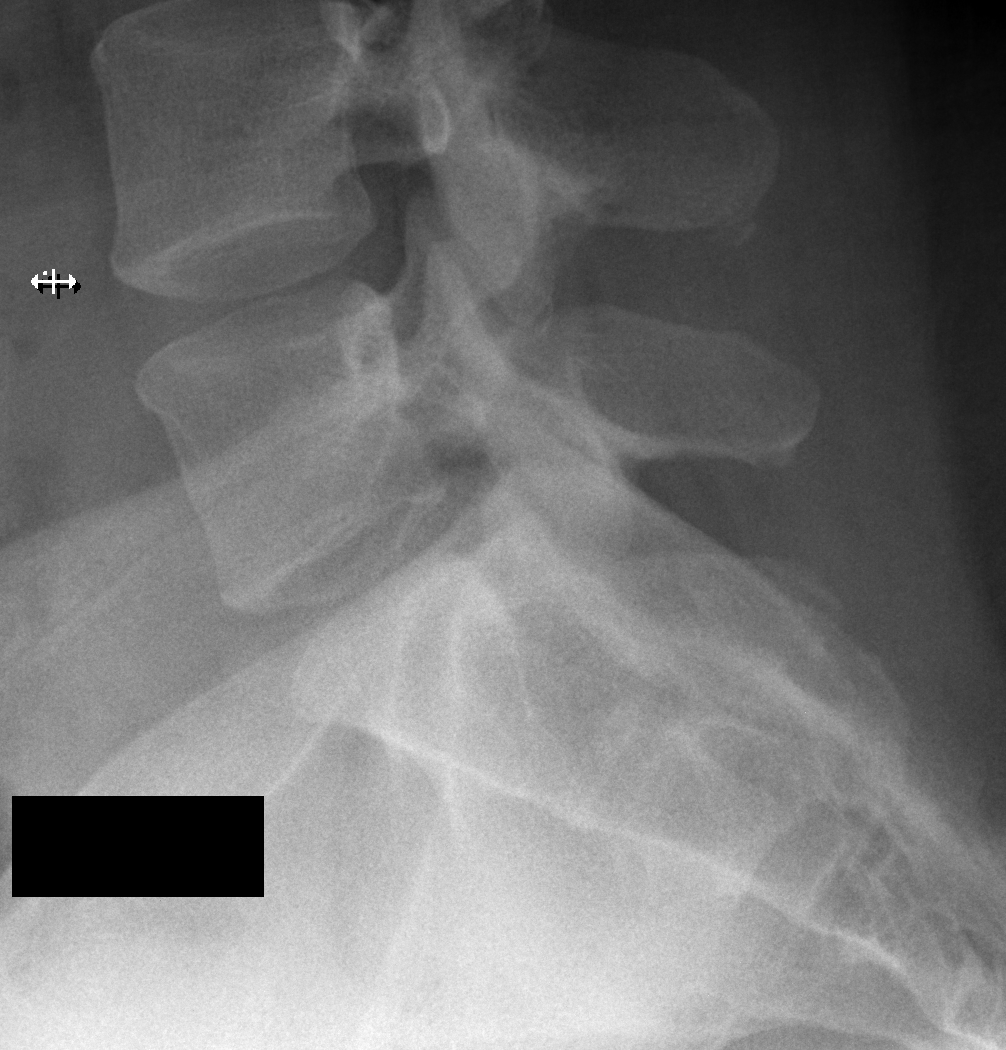

[3 of 3 positions shown; findings below may reference images not displayed]

FINDINGS: There is no evidence of lumbar spine fracture. Alignment is normal.
Intervertebral disc spaces are maintained.
IMPRESSION: Normal exam.

## 2021-05-15 ENCOUNTER — Encounter: Payer: Self-pay | Admitting: Physician Assistant

## 2021-05-15 NOTE — Progress Notes (Signed)
Per response from [facility], they have "no treatment at this facility for the dates of service you requested."  ROI was sent requesting last pap  results.

## 2022-03-01 DIAGNOSIS — R0789 Other chest pain: Secondary | ICD-10-CM | POA: Diagnosis not present

## 2022-03-01 DIAGNOSIS — R079 Chest pain, unspecified: Secondary | ICD-10-CM | POA: Diagnosis not present

## 2022-03-01 DIAGNOSIS — R791 Abnormal coagulation profile: Secondary | ICD-10-CM | POA: Diagnosis not present

## 2022-03-04 ENCOUNTER — Telehealth: Payer: Self-pay

## 2022-03-04 NOTE — Telephone Encounter (Signed)
Transition Care Management Follow-up Telephone Call ?Date of discharge and from where: 03/01/2022 from [facility] ?How have you been since you were released from the hospital? Patient stated that she is feeling better and did not have any questions or concerns at this time.  ?Any questions or concerns? No ? ?Items Reviewed: ?Did the pt receive and understand the discharge instructions provided? Yes  ?Medications obtained and verified? Yes  ?Other? No  ?Any new allergies since your discharge? No  ?Dietary orders reviewed? No ?Do you have support at home? Yes  ? ?Functional Questionnaire: (I = Independent and D = Dependent) ?ADLs: I ? ?Bathing/Dressing- I ? ?Meal Prep- I ? ?Eating- I ? ?Maintaining continence- I ? ?Transferring/Ambulation- I ? ?Managing Meds- I ? ? ?Follow up appointments reviewed: ? ?PCP Hospital f/u appt confirmed? No  Patient stated that she has moved to a new county and is working on transfering information to the new county.  ?Specialist Hospital f/u appt confirmed? No   ?Are transportation arrangements needed? No  ?If their condition worsens, is the pt aware to call PCP or go to the Emergency Dept.? Yes ?Was the patient provided with contact information for the PCP's office or ED? Yes ?Was to pt encouraged to call back with questions or concerns? Yes ? ?

## 2022-05-02 DIAGNOSIS — H60501 Unspecified acute noninfective otitis externa, right ear: Secondary | ICD-10-CM | POA: Diagnosis not present

## 2022-05-07 DIAGNOSIS — H60313 Diffuse otitis externa, bilateral: Secondary | ICD-10-CM | POA: Diagnosis not present

## 2023-05-02 ENCOUNTER — Telehealth: Payer: Self-pay

## 2023-05-02 NOTE — Transitions of Care (Post Inpatient/ED Visit) (Signed)
05/02/2023  Name: Malaysha Arlen MRN: [id number] DOB: 12-14-1985  Today's TOC FU Call Status: Today's TOC FU Call Status:: Successful TOC FU Call Competed TOC FU Call Complete Date: 05/02/23  Transition Care Management Follow-up Telephone Call Date of Discharge: 04/30/23 Discharge Facility: Other ([facility]) Name of Other ([facility]) [facility]charge Facility: [facility] Type of Discharge: Emergency Department Reason for ED Visit: Other: How have you been since you were released from the hospital?: Better Any questions or concerns?: No  Items Reviewed: Did you receive and understand the discharge instructions provided?: Yes Medications obtained,verified, and reconciled?: Yes (Medications Reviewed) Any new allergies since your discharge?: No Dietary orders reviewed?: NA Do you have support at home?: No  Medications Reviewed Today: Medications Reviewed Today     Reviewed by Matt Holmes, PA (Physician Assistant) on 03/20/21 at 1647  Med List Status: <None>   Medication Order Taking? Sig Documenting Provider Last Dose Status Informant  acetaminophen (TYLENOL) 650 MG CR tablet [id number] Yes Take 650 mg by mouth 2 (two) times daily as needed for pain. [provider] Taking Active Self  albuterol (PROVENTIL HFA;VENTOLIN HFA) 108 (90 Base) MCG/ACT inhaler [id number] No Inhale 2 puffs into the lungs every 6 (six) hours as needed for wheezing or shortness of breath.  Patient not taking: Reported on 03/19/2021   Particia Nearing, PA-C Not Taking Active   baclofen (LIORESAL) 10 MG tablet [id number] No baclofen 10 mg tablet  TAKE 1 TABLET BY MOUTH EVERY DAY  Patient not taking: Reported on 03/19/2021   [provider] Not Taking Active   ibuprofen (ADVIL) 600 MG tablet [id number] Yes ibuprofen 600 mg tablet  TAKE 1 TABLET BY MOUTH EVERY 6 HOURS AS NEEDED [provider] Taking Active   ipratropium (ATROVENT) 0.03 % nasal spray [id number] No Place 2  sprays into both nostrils 3 (three) times daily as needed for rhinitis.  Patient not taking: Reported on 03/19/2021   Michela Pitcher A, PA-C Not Taking Active   meloxicam (MOBIC) 7.5 MG tablet [id number] No Take 7.5 mg by mouth daily.  Patient not taking: Reported on 03/19/2021   [provider] Not Taking Active   Olopatadine HCl (PAZEO) 0.7 % SOLN [id number] No Place 1 drop into both eyes daily.  Patient not taking: Reported on 03/19/2021   [provider] Not Taking Active            Med Note Otis Dials Dec 15, 2019  3:44 PM) As needed  ondansetron (ZOFRAN ODT) 4 MG disintegrating tablet [id number] Yes Take 1 tablet (4 mg total) by mouth every 8 (eight) hours as needed. Particia Nearing, [place] Taking Active   pantoprazole (PROTONIX) 40 MG tablet [id number] Yes TAKE 1 TABLET BY MOUTH EVERY DAY Particia Nearing, PA-C Taking Active   topiramate (TOPAMAX) 50 MG tablet [id number] No Take 1 tablet (50 mg total) by mouth 2 (two) times daily.  Patient not taking: Reported on 03/19/2021   Particia Nearing, PA-C Not Taking Active             Home Care and Equipment/Supplies: Were Home Health Services Ordered?: NA Any new equipment or medical supplies ordered?: NA  Functional Questionnaire: Do you need assistance with bathing/showering or dressing?: No Do you need assistance with meal preparation?: No Do you need assistance with eating?: No Do you have difficulty maintaining continence: No Do you need assistance with getting out of bed/getting out of a chair/moving?: No Do you have difficulty  managing or taking your medications?: No  Follow up appointments reviewed: PCP Follow-up appointment confirmed?: No MD Provider Line [phone] Given: No Specialist Hospital Follow-up appointment confirmed?: No Do you need transportation to your follow-up appointment?: No Do you understand care options if your condition(s) worsen?: Yes-patient verbalized  understanding  Patient is no longer a patient at [facility].   SIGNATURE: Wilhemena Durie, CMA
